# Patient Record
Sex: Male | Born: 1955 | Race: Black or African American | Hispanic: No | Marital: Married | Smoking: Never smoker
Health system: Southern US, Community
[De-identification: ages and names within clinical notes are randomized; demographics above are authoritative.]

## PROBLEM LIST (undated history)

## (undated) DIAGNOSIS — K219 Gastro-esophageal reflux disease without esophagitis: Secondary | ICD-10-CM

## (undated) DIAGNOSIS — E785 Hyperlipidemia, unspecified: Secondary | ICD-10-CM

## (undated) DIAGNOSIS — E0590 Thyrotoxicosis, unspecified without thyrotoxic crisis or storm: Secondary | ICD-10-CM

## (undated) DIAGNOSIS — M4800 Spinal stenosis, site unspecified: Secondary | ICD-10-CM

## (undated) DIAGNOSIS — K317 Polyp of stomach and duodenum: Secondary | ICD-10-CM

## (undated) DIAGNOSIS — G479 Sleep disorder, unspecified: Secondary | ICD-10-CM

## (undated) DIAGNOSIS — Z862 Personal history of diseases of the blood and blood-forming organs and certain disorders involving the immune mechanism: Secondary | ICD-10-CM

## (undated) DIAGNOSIS — R001 Bradycardia, unspecified: Secondary | ICD-10-CM

## (undated) DIAGNOSIS — K8020 Calculus of gallbladder without cholecystitis without obstruction: Secondary | ICD-10-CM

## (undated) DIAGNOSIS — Z9889 Other specified postprocedural states: Secondary | ICD-10-CM

## (undated) DIAGNOSIS — I10 Essential (primary) hypertension: Secondary | ICD-10-CM

## (undated) HISTORY — DX: Sleep disorder, unspecified: G47.9

## (undated) HISTORY — DX: Thyrotoxicosis, unspecified without thyrotoxic crisis or storm: E05.90

## (undated) HISTORY — DX: Calculus of gallbladder without cholecystitis without obstruction: K80.20

## (undated) HISTORY — DX: Hyperlipidemia, unspecified: E78.5

## (undated) HISTORY — PX: CHOLECYSTECTOMY: SHX55

## (undated) HISTORY — DX: Personal history of diseases of the blood and blood-forming organs and certain disorders involving the immune mechanism: Z86.2

## (undated) HISTORY — DX: Gastro-esophageal reflux disease without esophagitis: K21.9

## (undated) HISTORY — PX: NECK SURGERY: SHX720

## (undated) HISTORY — DX: Polyp of stomach and duodenum: K31.7

## (undated) HISTORY — DX: Bradycardia, unspecified: R00.1

---

## 2007-04-15 ENCOUNTER — Observation Stay (HOSPITAL_COMMUNITY): Admission: EM | Admit: 2007-04-15 | Discharge: 2007-04-17 | Payer: Self-pay | Admitting: Emergency Medicine

## 2007-04-16 ENCOUNTER — Encounter (INDEPENDENT_AMBULATORY_CARE_PROVIDER_SITE_OTHER): Payer: Self-pay | Admitting: Internal Medicine

## 2007-04-17 ENCOUNTER — Encounter: Payer: Self-pay | Admitting: Internal Medicine

## 2007-04-23 ENCOUNTER — Ambulatory Visit: Payer: Self-pay | Admitting: Internal Medicine

## 2007-04-24 ENCOUNTER — Encounter: Payer: Self-pay | Admitting: Internal Medicine

## 2007-04-24 ENCOUNTER — Ambulatory Visit: Payer: Self-pay | Admitting: Internal Medicine

## 2007-04-25 ENCOUNTER — Ambulatory Visit (HOSPITAL_COMMUNITY): Admission: RE | Admit: 2007-04-25 | Discharge: 2007-04-25 | Payer: Self-pay | Admitting: Internal Medicine

## 2007-06-27 ENCOUNTER — Encounter (INDEPENDENT_AMBULATORY_CARE_PROVIDER_SITE_OTHER): Payer: Self-pay | Admitting: Surgery

## 2007-06-27 ENCOUNTER — Encounter (INDEPENDENT_AMBULATORY_CARE_PROVIDER_SITE_OTHER): Payer: Self-pay | Admitting: *Deleted

## 2007-06-27 ENCOUNTER — Ambulatory Visit (HOSPITAL_COMMUNITY): Admission: RE | Admit: 2007-06-27 | Discharge: 2007-06-27 | Payer: Self-pay | Admitting: Surgery

## 2008-05-01 ENCOUNTER — Ambulatory Visit: Payer: Self-pay | Admitting: Internal Medicine

## 2008-05-01 DIAGNOSIS — R079 Chest pain, unspecified: Secondary | ICD-10-CM

## 2008-05-01 DIAGNOSIS — R1013 Epigastric pain: Secondary | ICD-10-CM | POA: Insufficient documentation

## 2008-05-02 LAB — CONVERTED CEMR LAB
AST: 24 units/L (ref 0–37)
Albumin: 4.1 g/dL (ref 3.5–5.2)
Amylase: 85 units/L (ref 27–131)
BUN: 11 mg/dL (ref 6–23)
Basophils Relative: 0.6 % (ref 0.0–3.0)
CO2: 0 meq/L — CL (ref 19–32)
Calcium: 9.7 mg/dL (ref 8.4–10.5)
Chloride: 105 meq/L (ref 96–112)
Creatinine, Ser: 1.2 mg/dL (ref 0.4–1.5)
Eosinophils Absolute: 0 10*3/uL (ref 0.0–0.7)
Eosinophils Relative: 0.6 % (ref 0.0–5.0)
GFR calc Af Amer: 82 mL/min
GFR calc non Af Amer: 68 mL/min
Glucose, Bld: 100 mg/dL — ABNORMAL HIGH (ref 70–99)
Lymphocytes Relative: 28.8 % (ref 12.0–46.0)
MCV: 86.1 fL (ref 78.0–100.0)
Monocytes Relative: 12.7 % — ABNORMAL HIGH (ref 3.0–12.0)
Neutrophils Relative %: 57.3 % (ref 43.0–77.0)
Platelets: 255 10*3/uL (ref 150–400)
Potassium: 3.6 meq/L (ref 3.5–5.1)
RBC: 4.73 M/uL (ref 4.22–5.81)
WBC: 5.7 10*3/uL (ref 4.5–10.5)

## 2008-05-09 ENCOUNTER — Ambulatory Visit (HOSPITAL_COMMUNITY): Admission: RE | Admit: 2008-05-09 | Discharge: 2008-05-09 | Payer: Self-pay | Admitting: Internal Medicine

## 2008-05-13 ENCOUNTER — Telehealth (INDEPENDENT_AMBULATORY_CARE_PROVIDER_SITE_OTHER): Payer: Self-pay

## 2008-05-13 ENCOUNTER — Encounter: Payer: Self-pay | Admitting: Internal Medicine

## 2008-09-05 ENCOUNTER — Encounter: Payer: Self-pay | Admitting: Internal Medicine
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
## 2010-08-10 NOTE — H&P (Signed)
NAME:  [name], [name]                 ACCOUNT NO.:  [id number]   MEDICAL RECORD NO.:  [id number]          PATIENT TYPE:  INP   LOCATION:  1429                         FACILITY:  [facility]   PHYSICIAN:  Hind I Elsaid, MD      DATE OF BIRTH:  Oct 05, 1955   DATE OF ADMISSION:  04/15/2007  DATE OF DISCHARGE:                              HISTORY & PHYSICAL   CHIEF COMPLAINT:  Chest pain.   HISTORY OF PRESENT ILLNESS:  This is a 55 year old, African American  male admitted to [facility] with a chief complaint of chest  pain.  Chest pain started three days ago mainly at the left sternal  chest radiating to the left arm associated with numbness in his right  arm.  Pain is intermittent, aggravated with exertion.  Denies any  relieving factor.  Denies any aggravating factor.  Condition associated  with nausea and was pale.  Yesterday, the patient woke up with severe  left retrosternal chest pain which radiated to his left arm associated  with nausea and some paleness.  The patient has a history of chest pain  before, was investigated after he developed cardiac arrest, as per his  wife, at [facility] and the patient was transferred to [facility] where he underwent cardiac cath.  Cardiac cath, as per his  wife, was negative and chest pain was related to GI and the patient was  prescribed Prevacid.  Family was advised to followup with outpatient  EGD.   PAST MEDICAL HISTORY:  1. History of hypertension.  2. History of cardiac arrest.  History regarding the cardiac arrest      cause is really unclear.  The patient was admitted to [facility] with chest pain, received sublingual nitroglycerine.  His      heart rate started to become brady status post code status and      transferred to [facility].  Cause of his cardiac arrest was      unclear.   ALLERGIES:  No known drug allergies.   MEDICATIONS:  1. Atenolol 25 mg p.o. daily.  2. Prevacid 30 mg p.o.  daily.   FAMILY HISTORY:  Father has a history of congestive heart failure,  status post bivalve surgery.  Sister had a pacemaker and he had another  sister with a history of rheumatic fever.  Mother had a history of  hypertension.  Daughter was recently diagnosed with a pulmonary embolism  and diagnosis was factor 5 Leiden deficiency.   SOCIAL HISTORY:  The patient works as a Emergency planning/management officer.  No tobacco, no  alcohol abuse.  No IV drugs.  He is married and has five children.   SYSTEMIC REVIEW:  The patient denies any headache, denies any numbness  or weakness.  Admitted he has some dizziness, but he denies any  shortness of breath.  Denies any rashes.  Denies any syncopal episode.   PHYSICAL EXAMINATION:  VITAL SIGNS:  Temperature 97.7, blood pressure  128/70, pulse rate 66 sometimes drops to 52 on cardiac monitor,  respiratory rate 18,  saturation 98% on room air.  HEENT:  Normocephalic, atraumatic.  Pupils equal and react to light in  accommodation.  NECK:  Supple, no JVD and no thyromegaly.  HEART:  S1 and S2, regular rate and rhythm, no murmur.  LUNGS:  Clear to auscultation bilaterally.  ABDOMEN:  Soft, non-tender, bowel sounds positive.  EXTREMITIES:  No lower extremity edema.  Peripheral pulses intact.  NEUROLOGICAL:  Alert and oriented x 3 with no focal neurological  findings.   BLOOD WORKUP:  CBC showed white blood cells 6.9, hemoglobin 14.5,  hematocrit 42, platelets 290.  BMET sodium  140, potassium 3.4, chloride  101, CO2 32, glucose 123, BUN 16 and creatinine 1.39.  Cardiac markers  x1negative.  Chest x-ray negative for pneumonia or any cardiac process.  EKG sinus brady with 30 degree AV block.   ASSESSMENT/PLAN:  1. This is a 55 year old male with a history of hypertension, history      of previous cardiac arrest - cause was unclear, status post cardiac      cath which was negative in 2004:  Plan for this patient to be      admitted to the telemetry unit, get  cardiac enzymes and 2D echo,      place the patient on aspirin.  Will hold beta blocker as the      patient is sinus brady with 30 degree AV block.  Will consult      cardiology to evaluate the patient.  2. History of gastroesophageal reflux:  Chest pain could be      gastrointestinal in nature.  Will arrange outpatient EGD.  Possible      24-pH monitor.  3. History of hypercoagulable state:  Will get hypercoagulable panel      and CT chest with IV contrast to rule out pulmonary embolism.  DVT      and GI prophylaxis      Hind I Elsaid, MD  Electronically Signed     HIE/MEDQ  D:  04/16/2007  T:  04/16/2007  Job:  [id number]

## 2010-08-10 NOTE — Discharge Summary (Signed)
[name], [name]                 ACCOUNT NO.:  [id number]   MEDICAL RECORD NO.:  [id number]          PATIENT TYPE:  OUT   LOCATION:  XRAY                         FACILITY:  [facility]   PHYSICIAN:  Hind I Elsaid, MD      DATE OF BIRTH:  04-19-1955   DATE OF ADMISSION:  04/25/2007  DATE OF DISCHARGE:  04/25/2007                               DISCHARGE SUMMARY   DISCHARGE DIAGNOSES:  1. Atypical chest pain.  2. Epigastric pain needs to be reevaluated as outpatient.  3. Hypertension.  4. History of cardiac arrest and cause is unclear.  5. Family history of hypercoagulability.   DISCHARGE MEDICATIONS:  1. Hydrochlorothiazide 12.5 mg p.o. daily.  2. Protonix 40 mg p.o. daily.   PROCEDURE:  1. Myocardial view was negative for ischemia.  Normal wall motion      study with calculated left ventricular ejection fraction 62.  2. CT angio negative for pulmonary embolism.  3. Chest x-ray; no active lung disease.   HISTORY OF PRESENT ILLNESS:  Please review the history done by Hind Bosie Helper, M.D.  In summary, he is a 55 year old male with a recent  hospitalization for epigastric and chest pain and was admitted to the  hospital for evaluation of epigastric and chest pain.   HOSPITAL COURSE:  The patient was admitted to the hospital.  EKG and  cardiac enzymes were negative.  The patient has mild episode of  bradycardia.  For that cardiology was consulted and they recommended a  stress Myoview which resulted as above negative.  MI and ischemia was  ruled out.  The patient continued to complain of epigastric pain and as  the patient has a history of hypercoagulable studies diagnosed on his  daughter recently, after discovered to have factor V Leiden deficiency.  Accordingly CT of chest with IV contrast was done which was negative for  any evidence of PE.  Cause of chest pain is most probably  gastroenterology in nature.  The patient accordingly will be discharged  on Protonix p.o.  The patient  had a scheduled appointment to see  Dr.  Leone Payor from [facility] for evaluation of the epigastric  pain, possible EGD and possible 24-hour pH monitor.  The patient's  appointment for Monday, January 26, was given to the patient and his  wife.   History of hypercoagulable state.  As mentioned, daughter was diagnosed  with factor V Leiden deficiency.  Hypercoagulable studies were drawn.  The results are pending.  I will call the patient after the results of  hypercoagulable studies come back.  If any abnormality, the patient may  need to follow with hematology as outpatient.  At this time there is not  any indication to start the patient on any anticoagulation.      Hind Bosie Helper, MD  Electronically Signed     HIE/MEDQ  D:  06/04/2007  T:  06/05/2007  Job:  [id number]

## 2010-08-10 NOTE — Op Note (Signed)
[name], [name]                 ACCOUNT NO.:  [id number]   MEDICAL RECORD NO.:  [id number]          PATIENT TYPE:  AMB   LOCATION:  DAY                          FACILITY:  [facility]   PHYSICIAN:  Thomas A. Cornett, M.D.DATE OF BIRTH:  11/06/1955   DATE OF PROCEDURE:  06/27/2007  DATE OF DISCHARGE:                               OPERATIVE REPORT   PREOPERATIVE DIAGNOSIS:  Symptomatic cholelithiasis.   POSTOPERATIVE DIAGNOSIS:  Symptomatic cholelithiasis.   PROCEDURE:  Laparoscopic cholecystectomy with intraoperative  cholangiogram.   SURGEON:  Harriette Bouillon, M.D.   ASSISTANT:  Wilmon Arms. Tsuei, M.D.   ANESTHESIA:  General endotracheal anesthesia with 0.25% Sensorcaine  local.   ESTIMATED BLOOD LOSS:  10 mL.   SPECIMEN:  Gallbladder to pathology for evaluation.   INDICATIONS FOR PROCEDURE:  The patient is a 55 year old male with  intermittent right upper quadrant pain, nausea, vomiting.  He presents  after workup showed gallstones.  We discussed options of both operative  and nonoperative treatment.  He wished to undergo laparoscopic  cholecystectomy for symptomatic cholelithiasis.  Risks were discussed.  Informed consent was obtained.   DESCRIPTION OF PROCEDURE:  The patient was brought to the operating room  and placed supine.  After induction of general anesthesia, the abdomen  was prepped and draped in sterile fashion.  He received preoperative  antibiotics.  A 1-cm infraumbilical incision was made.  Dissection was  carried down to fascia.  The fascia was opened with a scalpel blade.  Small hemostat was used to spread into the preperitoneal space and I  used my finger to push through the peritoneal lining of the abdominal  cavity without difficulty.  Pursestring suture of 0 Vicryl was placed  and a 12-mm Hasson cannula was placed under direct vision.  Pneumoperitoneum was created to 15 mmHg CO2 and laparoscope was placed.  No evidence of bowel injury with insertion of  this trocar.  The patient  was placed in reverse Trendelenburg.  A laparoscopy performed.  Liver,  stomach, small and large bowel all appeared grossly normal.  Gallbladder  is identified.  The 11 mm subxiphoid port was placed under direct  vision.  Two 5 mL ports were placed in the right upper quadrant under  direct vision.  Gallbladder is identified and grabbed by its dome and  pushed toward the patient's right shoulder.  Some loose omental  adhesions were taken down with the cautery.  The infundibulum was  grasped and pulled toward the patient's right lower quadrant.  Were able  to dissect out both the cystic duct and cystic artery as it coursed onto  the body of the gallbladder.  Once we got around the cystic duct, a clip  was placed on the gallbladder side.  I went ahead and ligated the cystic  artery to give me more length.  We put this between clips.  Small  incision was made in the cystic duct into a separate stab wound and a  Cook cholangiogram catheter was introduced and placed in the cystic  duct.  This was controlled by clip.  Intraoperative cholangiogram was  performed which showed free flow of contrast down a tortuous cystic duct  into the common bile duct with free flow into the duodenum.  There was a  slight pulse.  Contrast went up the common hepatic duct and the right  and left hepatic ducts without difficulty.  No dilatation noted.  No  evidence of stone, stricture or extravasation.  Cholangiogram was  completed, the catheter was removed, the cystic duct was triple clipped  and divided.  The cautery was used to dissect the gallbladder from the  gallbladder fossa.  A clip was placed on a small branch the posterior  cystic artery.  Remainder of the gallbladder came out uneventfully.  It  was placed in EndoCatch bag.  We then irrigated the gallbladder bed and  found it to be hemostatic.  We then extracted the gallbladder through  the umbilical port with the help of the  camera the subxiphoid port.  This was passed off the field.  We closed the fascia at the umbilicus  with a pursestring suture of 0 Vicryl under direct vision.  No evidence  of small bowel, large bowel or other organ injury at this point in the  procedure.  There was no evidence of bleeding or bile leakage.  Excess  irrigation was suctioned out.  We then let the CO2 escape and removed  our ports under direct vision with no signs of port site leak.  Once all  ports were removed.  Skin was closed with 4-0 Monocryl.  Dermabond was  applied.  All final counts of sponge, needle and instruments were found  to be correct at this portion of the case.  The patient was then awoke  and taken to recovery in satisfactory condition.  All final counts were  correct.      Thomas A. Cornett, M.D.  Electronically Signed     TAC/MEDQ  D:  06/27/2007  T:  06/27/2007  Job:  [id number]   cc:   Dr. Meredith Mody   [name], MD,FACG  [facility]  [address] [place], [place] [address]

## 2010-08-10 NOTE — Assessment & Plan Note (Signed)
[facility]                         GASTROENTEROLOGY OFFICE NOTE   [name], [name]                          MRN:          [id number]  DATE:04/23/2007                            DOB:          13-Oct-1955    ADDENDUM:  [name] had a hypercoagulable workup because of a history of  some hypercoagulable issue.  He did have a positive anticardiolipin  antibody IgA, but his IgG and IgM were negative. `     [name], MD,FACG     CEG/MedQ  DD: 04/23/2007  DT: 04/23/2007  Job #: [id number]   cc:   Dr. Doreene Adas, MD

## 2010-12-17 LAB — CARDIAC PANEL(CRET KIN+CKTOT+MB+TROPI)
CK, MB: 1.5
Total CK: 163
Troponin I: 0.04
Troponin I: 0.04

## 2010-12-17 LAB — DIFFERENTIAL
Basophils Relative: 0
Eosinophils Absolute: 0
Lymphs Abs: 1
Neutro Abs: 5.3
Neutrophils Relative %: 77

## 2010-12-17 LAB — CBC
HCT: 39.4
MCHC: 34.2
MCV: 86.3
MCV: 87.2
Platelets: 290
RBC: 4.52
RBC: 4.87
WBC: 5.5
WBC: 6.9

## 2010-12-17 LAB — POCT CARDIAC MARKERS
CKMB, poc: <1 — ABNORMAL LOW
Myoglobin, poc: 78.9
Operator id: 4074

## 2010-12-17 LAB — PROTEIN C, TOTAL: Protein C, Total: 89 % (ref 70–140)

## 2010-12-17 LAB — BASIC METABOLIC PANEL
BUN: 16
BUN: 7
CO2: 30
Calcium: 8.9
Calcium: 9.7
Chloride: 101
Chloride: 107
Creatinine, Ser: 1.14
Creatinine, Ser: 1.39
GFR calc Af Amer: >60
Glucose, Bld: 98

## 2010-12-17 LAB — FACTOR 5 LEIDEN

## 2010-12-17 LAB — LIPID PANEL
LDL Cholesterol: 139 — ABNORMAL HIGH
Total CHOL/HDL Ratio: 6.7
VLDL: 21

## 2010-12-17 LAB — COMPREHENSIVE METABOLIC PANEL
BUN: 13
CO2: 29
Chloride: 104
Creatinine, Ser: 1.2
GFR calc non Af Amer: >60
Total Bilirubin: 1.2

## 2010-12-17 LAB — BETA-2-GLYCOPROTEIN I ABS, IGG/M/A
Beta-2-Glycoprotein I IgA: 7 U/mL (ref <10)
Beta-2-Glycoprotein I IgM: 18 U/mL (ref <10)

## 2010-12-17 LAB — PHOSPHORUS: Phosphorus: 3.4

## 2010-12-17 LAB — LUPUS ANTICOAGULANT PANEL

## 2010-12-17 LAB — HOMOCYSTEINE: Homocysteine: 11.5

## 2010-12-17 LAB — MAGNESIUM: Magnesium: 2.1

## 2010-12-17 LAB — CARDIOLIPIN ANTIBODIES, IGG, IGM, IGA: Anticardiolipin IgA: 40 (ref <13)

## 2010-12-17 LAB — PROTEIN S ACTIVITY: Protein S Activity: 70 % (ref 69–129)

## 2010-12-17 LAB — PROTEIN S, TOTAL: Protein S Ag, Total: 110 % (ref 70–140)

## 2010-12-21 LAB — COMPREHENSIVE METABOLIC PANEL
AST: 26
Albumin: 3.6
CO2: 30
Calcium: 9.1
Creatinine, Ser: 1.08
GFR calc Af Amer: >60
GFR calc non Af Amer: >60
Total Protein: 7.1

## 2010-12-21 LAB — CBC
MCHC: 35.4
MCV: 85.4
Platelets: 242
RDW: 13.7

## 2010-12-21 LAB — DIFFERENTIAL
Eosinophils Relative: 1
Lymphocytes Relative: 24
Lymphs Abs: 1.4
Monocytes Relative: 11

## 2011-02-02 ENCOUNTER — Encounter: Payer: Self-pay | Admitting: *Deleted

## 2011-02-02 ENCOUNTER — Emergency Department (INDEPENDENT_AMBULATORY_CARE_PROVIDER_SITE_OTHER)
Admission: EM | Admit: 2011-02-02 | Discharge: 2011-02-02 | Disposition: A | Discharge status: Home / Self Care | Payer: BC Managed Care – PPO | Source: Home / Self Care | Attending: Family Medicine | Admitting: Family Medicine

## 2011-02-02 DIAGNOSIS — R079 Chest pain, unspecified: Secondary | ICD-10-CM

## 2011-02-02 DIAGNOSIS — R1013 Epigastric pain: Secondary | ICD-10-CM

## 2011-02-02 DIAGNOSIS — I10 Essential (primary) hypertension: Secondary | ICD-10-CM

## 2011-02-02 HISTORY — DX: Essential (primary) hypertension: I10

## 2011-02-02 MED ORDER — IRBESARTAN-HYDROCHLOROTHIAZIDE 300-25 MG PO TABS: 1.0000 | ORAL_TABLET | Freq: Every day | ORAL | Status: DC

## 2011-02-02 MED ORDER — CLONIDINE HCL 0.2 MG PO TABS: 0.2000 mg | ORAL_TABLET | Freq: Once | ORAL | Status: DC

## 2011-02-02 NOTE — ED Notes (Signed)
Patient presents with HTN and a c/o chest tightness that is a "discomfort" and not painful. He was discharged from [facility] 2-3 weeks ago for CP and htn that was attributed to reflux.

## 2011-02-02 NOTE — ED Provider Notes (Signed)
History     CSN: [id number] Arrival date & time:    None       (Consider location/radiation/quality/duration/timing/severity/associated sxs/prior treatment) The history is provided by the patient and the spouse.  The patient is here for evaluation of his hypertension. He had been atn 2-3 weeks ago at [facility] had a stress test admitted for a couple of days and was sent home on low dose Cozaar. He was also placed on HCTZ as well. He was discharged w/a DX of HBP and reflux. He had some vague chest pain but nothing specific earlier and found his BP elevated and came in to be seen.   Review of Systems  Respiratory: Positive for chest tightness.   Cardiovascular: Positive for chest pain.  Gastrointestinal:       Refluxed  Neurological: Negative for light-headedness and headaches.  Psychiatric/Behavioral: Negative for behavioral problems, confusion and agitation.  All other systems reviewed and are negative.    Allergies  NTG    Home Medications  BP 159/98  Pulse 74  Temp(Src) 98.4 F (36.9 C) (Oral)  Resp 16  Ht 6\' 1"  (1.854 m)  Wt 220 lb (99.791 kg)  BMI 29.03 kg/m2  SpO2 99%  Physical Exam  Constitutional: He is oriented to person, place, and time. He appears well-developed and well-nourished.  HENT:  Head: Normocephalic.  Neck: Normal range of motion. Neck supple.  Cardiovascular: Normal rate and regular rhythm.   Pulmonary/Chest: Effort normal and breath sounds normal. No respiratory distress. He has no wheezes.  Musculoskeletal: Normal range of motion.  Neurological: He is alert and oriented to person, place, and time.  Skin: Skin is warm and dry.    ED Course  Procedures (including critical care time) Patient BP has responded to medicationCatapress.2mg  po will allow a 2nd dose of cozaar tonight and switch to Avalide 300/25 or Avapro and HCTZ    MDM          Hassan Rowan, MD 02/04/11 1642

## 2013-03-28 DIAGNOSIS — R001 Bradycardia, unspecified: Secondary | ICD-10-CM

## 2013-03-28 HISTORY — DX: Bradycardia, unspecified: R00.1

## 2013-11-02 ENCOUNTER — Encounter (HOSPITAL_COMMUNITY): Payer: Self-pay | Admitting: Emergency Medicine

## 2013-11-02 ENCOUNTER — Emergency Department (HOSPITAL_COMMUNITY)
Admission: EM | Admit: 2013-11-02 | Discharge: 2013-11-02 | Disposition: A | Discharge status: Home / Self Care | Payer: BC Managed Care – PPO | Attending: Emergency Medicine | Admitting: Emergency Medicine

## 2013-11-02 DIAGNOSIS — Z8739 Personal history of other diseases of the musculoskeletal system and connective tissue: Secondary | ICD-10-CM | POA: Insufficient documentation

## 2013-11-02 DIAGNOSIS — Z8719 Personal history of other diseases of the digestive system: Secondary | ICD-10-CM | POA: Insufficient documentation

## 2013-11-02 DIAGNOSIS — I10 Essential (primary) hypertension: Secondary | ICD-10-CM | POA: Insufficient documentation

## 2013-11-02 DIAGNOSIS — Z79899 Other long term (current) drug therapy: Secondary | ICD-10-CM | POA: Insufficient documentation

## 2013-11-02 HISTORY — DX: Spinal stenosis, site unspecified: M48.00

## 2013-11-02 LAB — URINALYSIS, ROUTINE W REFLEX MICROSCOPIC
Bilirubin Urine: NEGATIVE
GLUCOSE, UA: NEGATIVE mg/dL
Hgb urine dipstick: NEGATIVE
KETONES UR: NEGATIVE mg/dL
LEUKOCYTES UA: NEGATIVE
NITRITE: NEGATIVE
PH: 6 (ref 5.0–8.0)
Protein, ur: NEGATIVE mg/dL
SPECIFIC GRAVITY, URINE: 1.007 (ref 1.005–1.030)
Urobilinogen, UA: 0.2 mg/dL (ref 0.0–1.0)

## 2013-11-02 LAB — COMPREHENSIVE METABOLIC PANEL
ALBUMIN: 3.9 g/dL (ref 3.5–5.2)
ALT: 18 U/L (ref 0–53)
ANION GAP: 12 (ref 5–15)
AST: 20 U/L (ref 0–37)
Alkaline Phosphatase: 73 U/L (ref 39–117)
BILIRUBIN TOTAL: 0.3 mg/dL (ref 0.3–1.2)
BUN: 8 mg/dL (ref 6–23)
CHLORIDE: 102 meq/L (ref 96–112)
CO2: 27 mEq/L (ref 19–32)
CREATININE: 0.98 mg/dL (ref 0.50–1.35)
Calcium: 9.4 mg/dL (ref 8.4–10.5)
GFR calc Af Amer: >90 mL/min (ref >90)
GFR calc non Af Amer: >90 mL/min (ref >90)
Glucose, Bld: 108 mg/dL — ABNORMAL HIGH (ref 70–99)
Potassium: 3.7 mEq/L (ref 3.7–5.3)
Sodium: 141 mEq/L (ref 137–147)
TOTAL PROTEIN: 7.4 g/dL (ref 6.0–8.3)

## 2013-11-02 MED ORDER — LOSARTAN POTASSIUM 25 MG PO TABS
25.0000 mg | ORAL_TABLET | Freq: Once | ORAL | Status: AC
  Administered 2013-11-02: 25 mg via ORAL
  Filled 2013-11-02: qty 1

## 2013-11-02 MED ORDER — IRBESARTAN-HYDROCHLOROTHIAZIDE 300-25 MG PO TABS: 1.0000 | ORAL_TABLET | Freq: Every day | ORAL | Status: DC

## 2013-11-02 MED ORDER — LOSARTAN POTASSIUM 25 MG PO TABS: 25.0000 mg | ORAL_TABLET | Freq: Every day | ORAL | Status: DC

## 2013-11-02 MED ORDER — LISINOPRIL 10 MG PO TABS: 10.0000 mg | ORAL_TABLET | Freq: Every day | ORAL | Status: DC

## 2013-11-02 MED ORDER — AMLODIPINE BESYLATE 5 MG PO TABS: 5.0000 mg | ORAL_TABLET | Freq: Every day | ORAL | Status: DC

## 2013-11-02 MED ORDER — HYDROCHLOROTHIAZIDE 25 MG PO TABS: 25.0000 mg | ORAL_TABLET | Freq: Every day | ORAL | Status: DC

## 2013-11-02 MED ORDER — HYDROCHLOROTHIAZIDE 50 MG PO TABS
50.0000 mg | ORAL_TABLET | Freq: Every day | ORAL | Status: DC
  Administered 2013-11-02: 50 mg via ORAL
  Filled 2013-11-02: qty 1

## 2013-11-02 NOTE — ED Provider Notes (Signed)
CSN: [id number]     Arrival date & time 11/02/13  0507 History   First MD Initiated Contact with Patient 11/02/13 [phone]     Chief Complaint  Patient presents with  . Hypertension    HPI Patient with PMH of Hypertension presents with "not feeling right," that started around midnight. He was unable to go back to sleep and at 4am he went to [facility] to take his blood pressure and it was 220/105. He has not taken his blood pressure medicine for 3 weeks because his PCP is in [place]. He reports no adversive effects from HTN meds. He denies chest pain, SOB, palpitations, peripheral edema, visual changes, weakness or confusion. He denies N/V/D or abdominal or back pain. His wife notes that he had urinary frequency.   Past Medical History  Diagnosis Date  . Hypertension   . Reflux   . Spinal stenosis    Past Surgical History  Procedure Laterality Date  . Cholecystectomy     Family History  Problem Relation Age of Onset  . Heart failure Father   . Hypertension Father   . Lung cancer Sister   . Pancreatic cancer Sister   . Cancer Sister   . Hypertension Mother    History  Substance Use Topics  . Smoking status: Never Smoker   . Smokeless tobacco: Never Used  . Alcohol Use: No    Review of Systems  Constitutional: Negative for fever and chills.  HENT: Negative for congestion and rhinorrhea.   Eyes: Negative for visual disturbance.  Respiratory: Negative for cough and shortness of breath.   Cardiovascular: Negative for chest pain and palpitations.  Gastrointestinal: Negative for nausea, vomiting and diarrhea.  Genitourinary: Negative for dysuria and hematuria.  Musculoskeletal: Negative for back pain.  Skin: Negative for rash.  Neurological: Negative for weakness and headaches.      Allergies  Nitroglycerin and Neurontin  Home Medications   Prior to Admission medications   Medication Sig Start Date End Date Taking? Authorizing Provider  hydrochlorothiazide (HYDRODIURIL) 25 MG  tablet Take 25 mg by mouth daily.      Historical Provider, MD  irbesartan-hydrochlorothiazide (AVALIDE) 300-25 MG per tablet Take 1 tablet by mouth daily. 02/02/11 02/02/12  Hassan RowanEugene Nunnelley, MD  losartan (COZAAR) 25 MG tablet Take 25 mg by mouth daily.      Historical Provider, MD   BP 168/91  Pulse 54  Temp(Src) 98.6 F (37 C) (Oral)  Resp 18  Ht 6\' 1"  (1.854 m)  Wt 215 lb (97.523 kg)  BMI 28.37 kg/m2  SpO2 97% Physical Exam  Nursing note and vitals reviewed. Constitutional: He is oriented to person, place, and time. He appears well-developed and well-nourished. No distress.  HENT:  Head: Normocephalic and atraumatic.  Mouth/Throat: Oropharynx is clear and moist.  Eyes: Conjunctivae and EOM are normal. Pupils are equal, round, and reactive to light. Right eye exhibits no discharge. Left eye exhibits no discharge. No scleral icterus.  Neck: No JVD present.  Cardiovascular: Normal rate, regular rhythm and normal heart sounds.   Pulmonary/Chest: Effort normal and breath sounds normal. No respiratory distress. He has no wheezes.  Abdominal: Soft. Bowel sounds are normal. He exhibits no distension. There is no tenderness.  Musculoskeletal: Normal range of motion. He exhibits no edema and no tenderness.  Neurological: He is alert and oriented to person, place, and time. He exhibits normal muscle tone. Coordination normal.  Strength and sensation intact bilaterally.  Skin: Skin is warm and dry. He is not  diaphoretic.  Psychiatric: He has a normal mood and affect. His behavior is normal.    ED Course  Procedures (including critical care time) Labs Review Labs Reviewed  URINALYSIS, ROUTINE W REFLEX MICROSCOPIC  COMPREHENSIVE METABOLIC PANEL    Imaging Review No results found.   EKG Interpretation None      MDM   Final diagnoses:  Essential hypertension   Patient with PMH of HTN who has not taken his meds for 3 weeks presents with "not feeling right," and HTN. BP on arrival was  190/96. Patient denies CP, back pain, visual changes. Patient administered home HTN medication regimen in ED. Patient's EKG and CMP unremarkable. Repeat BP check was 168/91 with no new symptomology. I doubt hypertensive emergency due to no evidence of end-organ damage. Patient encouraged to find a PCP nearby and to take his HTN meds daily. Patient given resources for new PCP. Patient advised of the long term effects of elevated bp. Will refill HTN meds for 2 months.  Patient complained of urinary frequency and UA is unremarkable.  Discussed return precautions with patient. Discussed all results and patient verbalizes understanding and agrees with plan.  This is a shared patient. This patient was discussed with the physician who saw and evaluated the patient.     Louann Sjogren, PA-C 11/02/13 [phone]

## 2013-11-02 NOTE — Discharge Instructions (Signed)
Return to the emergency room with worsening of symptoms or with symptoms that are concerning. Follow up with PCP. Resources provided below.   Hypertension Hypertension, commonly called high blood pressure, is when the force of blood pumping through your arteries is too strong. Your arteries are the blood vessels that carry blood from your heart throughout your body. A blood pressure reading consists of a higher number over a lower number, such as 110/72. The higher number (systolic) is the pressure inside your arteries when your heart pumps. The lower number (diastolic) is the pressure inside your arteries when your heart relaxes. Ideally you want your blood pressure below 120/80. Hypertension forces your heart to work harder to pump blood. Your arteries may become narrow or stiff. Having hypertension puts you at risk for heart disease, stroke, and other problems.  RISK FACTORS Some risk factors for high blood pressure are controllable. Others are not.  Risk factors you cannot control include:   Race. You may be at higher risk if you are African American.  Age. Risk increases with age.  Gender. Men are at higher risk than women before age 56 years. After age 56, women are at higher risk than men. Risk factors you can control include:  Not getting enough exercise or physical activity.  Being overweight.  Getting too much fat, sugar, calories, or salt in your diet.  Drinking too much alcohol. SIGNS AND SYMPTOMS Hypertension does not usually cause signs or symptoms. Extremely high blood pressure (hypertensive crisis) may cause headache, anxiety, shortness of breath, and nosebleed. DIAGNOSIS  To check if you have hypertension, your health care provider will measure your blood pressure while you are seated, with your arm held at the level of your heart. It should be measured at least twice using the same arm. Certain conditions can cause a difference in blood pressure between your right and  left arms. A blood pressure reading that is higher than normal on one occasion does not mean that you need treatment. If one blood pressure reading is high, ask your health care provider about having it checked again. TREATMENT  Treating high blood pressure includes making lifestyle changes and possibly taking medicine. Living a healthy lifestyle can help lower high blood pressure. You may need to change some of your habits. Lifestyle changes may include:  Following the DASH diet. This diet is high in fruits, vegetables, and whole grains. It is low in salt, red meat, and added sugars.  Getting at least 2 hours of brisk physical activity every week.  Losing weight if necessary.  Not smoking.  Limiting alcoholic beverages.  Learning ways to reduce stress. If lifestyle changes are not enough to get your blood pressure under control, your health care provider may prescribe medicine. You may need to take more than one. Work closely with your health care provider to understand the risks and benefits. HOME CARE INSTRUCTIONS  Have your blood pressure rechecked as directed by your health care provider.   Take medicines only as directed by your health care provider. Follow the directions carefully. Blood pressure medicines must be taken as prescribed. The medicine does not work as well when you skip doses. Skipping doses also puts you at risk for problems.   Do not smoke.   Monitor your blood pressure at home as directed by your health care provider. SEEK MEDICAL CARE IF:   You think you are having a reaction to medicines taken.  You have recurrent headaches or feel dizzy.  You have  swelling in your ankles.  You have trouble with your vision. SEEK IMMEDIATE MEDICAL CARE IF:  You develop a severe headache or confusion.  You have unusual weakness, numbness, or feel faint.  You have severe chest or abdominal pain.  You vomit repeatedly.  You have trouble breathing. MAKE SURE  YOU:   Understand these instructions.  Will watch your condition.  Will get help right away if you are not doing well or get worse. Document Released: 03/14/2005 Document Revised: 07/29/2013 Document Reviewed: 01/04/2013 [facility] Patient Information 2015 [place], [place]. This information is not intended to replace advice given to you by your health care provider. Make sure you discuss any questions you have with your health care provider.   Emergency Department Resource Guide 1) Find a Doctor and Pay Out of Pocket Although you won't have to find out who is covered by your insurance plan, it is a good idea to ask around and get recommendations. You will then need to call the office and see if the doctor you have chosen will accept you as a new patient and what types of options they offer for patients who are self-pay. Some doctors offer discounts or will set up payment plans for their patients who do not have insurance, but you will need to ask so you aren't surprised when you get to your appointment.  2) Contact Your Local Health Department Not all health departments have doctors that can see patients for sick visits, but many do, so it is worth a call to see if yours does. If you don't know where your local health department is, you can check in your phone book. The CDC also has a tool to help you locate your state's health department, and many state websites also have listings of all of their local health departments.  3) Find a Walk-in Clinic If your illness is not likely to be very severe or complicated, you may want to try a walk in clinic. These are popping up all over the country in pharmacies, drugstores, and shopping centers. They're usually staffed by nurse practitioners or physician assistants that have been trained to treat common illnesses and complaints. They're usually fairly quick and inexpensive. However, if you have serious medical issues or chronic medical problems, these are  probably not your best option.  No Primary Care Doctor: - Call Health Connect at  [phone] - they can help you locate a primary care doctor that  accepts your insurance, provides certain services, etc. - Physician Referral Service- [phone]  Chronic Pain Problems: Organization         Address  Phone   Notes  [facility]  [phone] Patients need to be referred by their primary care doctor.   Medication Assistance: Organization         Address  Phone   Notes  [facility] Medication [facility] [address] [place] [place], [place] [address] [phone] --Must be a resident of [facility] -- Must have NO insurance coverage whatsoever (no Medicaid/ Medicare, etc.) -- The pt. MUST have a primary care doctor that directs their care regularly and follows them in the community   [facility]  [phone]   [employer]  [phone]    Agencies that provide inexpensive medical care: Organization         Address  Phone   Notes  [facility]  [phone]   [facility]    [phone]  [facility] [address] [place], [place] [address] [phone]   [facility] [address] [place] [place] [phone]   Planned Parenthood    [phone]   [facility]    [phone]   [facility] and [facility]  [address], [place] Phone:  [phone], Fax:  [phone] Hours of Operation:  9 am - 6 pm, M-F.  Also accepts Medicaid/Medicare and self-pay.  [facility]  [address], [place] Phone: [phone], Fax: [phone]. Hours of Operation:  8:30 am - 5:30 pm, M-F.  Also accepts Medicaid and self-pay.  [facility] [place] [address], [place] Phone: [phone]   [facility] [address] Natasha Bence [place], [place] [phone], Ext. [phone] Mondays & Thursdays:  7-9 AM.  First 15 patients are seen on a first come, first serve basis.    Medicaid-accepting [facility] Providers:  Organization         Address  Phone   Notes  [facility] [address], [place] [phone] Also accepts self-pay patients.  [facility] [address] Laurell Josephs [place], [place]  [phone]   [facility] [address], [place] [phone]   [facility] [address], [place] [phone]   Renaye Rakers [address], [place]   [phone] Only accepts [place] Access [place] patients after they have their name applied to their card.   Self-Pay (no insurance) in [facility]:  Organization         Address  Phone   Notes  Sickle Cell Patients, [facility] [address] [place], [place] [phone]   [facility] [address] [place], [place] [phone]   [facility] [place]  [address],  [phone]   [facility]/Dr. Osei-Bonsu  [address] [place] or [address], [place] [phone] Phone number for both [place] and [place] locations is the same.  Urgent Medical and [facility] [address], [place] [phone]   [facility] [address] [place] or [address] [phone]   [facility] [address], [place] [phone], phone; [phone], fax Sees patients 1st and 3rd Saturday of every month.  Must not qualify for public or private insurance (i.e. Medicaid, Medicare, [place] Health Choice, Veterans' Benefits)  Household income should be no more than 200% of the poverty level The clinic cannot treat you if you are pregnant or think you are pregnant  Sexually transmitted diseases are not treated at the clinic.     Dental Care: Organization         Address  Phone  Notes  [facility] Department of [facility] [address] [place], [place] [phone] Accepts children up to age 77 who are enrolled in [place] or [place] Health Choice; pregnant women with a Medicaid card; and children who have applied for Medicaid or [place] Health Choice, but were declined, whose parents can pay a reduced fee at time of service.  [facility] Department of [facility]  [address], [place] [phone] Accepts children up to age 62 who  are enrolled in Medicaid or [place] Health Choice; pregnant women with a Medicaid card; and children who have applied for Medicaid or  Health Choice, but were declined, whose parents can pay a reduced fee at time of service.  [facility]  [address] [place], [place] [phone] Patients are seen by appointment only. Walk-ins are not accepted. [facility] will see patients 38 years of age and older. Monday - Tuesday (8am-5pm) Most Wednesdays (8:30-5pm) $30 per visit, cash only  [facility] Adult Dental Access PROGRAM  [address], [facility] [phone] Patients are seen by appointment only. Walk-ins are not accepted. [facility] will see patients 4 years of age and older. One Wednesday Evening (Monthly: Volunteer Based).  $30 per visit, cash only  [employer]  [phone] for adults; Children under age 56, call Graduate Pediatric Dentistry at [phone]. Children aged 35-14, please call [phone] to request a pediatric application.  Dental services are provided in all areas of dental care including fillings, crowns and bridges, complete and partial dentures, implants, gum treatment, root canals, and extractions. Preventive care is also provided. Treatment is provided to both adults and children. Patients are selected via a lottery and there is often a waiting  list.   [facility] [address] [place]  [phone] [email or web address]   [facility] [address] [place], [place] [phone], Ext. [phone] Second and Fourth Thursday of each month, opens at 6:30 AM; Clinic ends at 9 AM.  Patients are seen on a first-come first-served basis, and a limited number are seen during each clinic.   [facility]  [address] Ether Griffins [place], [place] [phone]   Eligibility Requirements You must have lived in [place], [place], or [place] counties for at least the last three months.   You cannot be eligible for state or federal sponsored [place], including [employer], [place], or [employer].   You generally cannot be eligible for healthcare insurance through your employer.    How to apply: Eligibility screenings are held every Tuesday and Wednesday afternoon from 1:00 pm until 4:00 pm. You do not need an appointment for the interview!  [facility] [address] [place], [place] [phone]   [facility] Health Department  [phone]   [facility] Health Department  [phone]   [facility] Health Department  [phone]    Behavioral Health Resources in the Community: Intensive Outpatient Programs Organization         Address  Phone  Notes  [facility] [address] [place], [place] [phone]   [facility] Outpatient [address] [place], [place] [phone]   [facility] [address], [place], [place]  [phone]   [facility] Mental Health [address],  [place], [place] [phone] or [phone]   Substance Abuse Resources Organization         Address  Phone  Notes  [facility]  [phone]   [facility]  [phone]   [facility]  8162728429   Floydene Flock  [phone]   Residential & Outpatient Substance Abuse Program   [phone]   Psychological Services Organization         Address  Phone  Notes  [facility] Behavioral Health  [phone]   [facility] Services  [phone]   [facility] Mental Health [address],  [place] [phone] or [phone]    Mobile Crisis Teams Organization         Address  Phone  Notes  [facility], Mobile Crisis Care Unit  [phone]   [facility]  [address] [place], [place] [phone]   Doristine Locks [address] [place] [phone]    Self-Help/Support Groups Organization         Address  Phone             Notes  [facility] [place] - variety of support groups  336- [phone] Call for more information  Narcotics Anonymous (NA), [facility] [address], [employer] [place]  2 meetings at this location   Statistician         Address  Phone  Notes  [facility] [address],    [place]  [phone]   [facility]  [address] [place] [address], [place], [place] [phone]   [facility] [address] [place], [place] [phone] Admissions: 8am-3pm M-F  [facility] [address]    [place], [place] [phone]   [facility] [address] [place], [place], [place] [phone]   [facility] [address],  [place], [place] [phone]   [facility] - Intensive Outpatient [address] Laurell Josephs [address], [place], [place] [phone]   [facility] ([facility]iction [facility] [address] [place]  [place], [place] [phone] or [phone]   [facility] ([facility]) [address], [place], [place] [phone] Accepts Medicaid  [facility] [address],  [place] [phone] Substance Abuse/Addiction Treatment   [facility] Organization          Address  Phone  Notes  [facility]  [phone]   Angie Fava, PhD [address] Ervin Knack [place], [place]   [phone] or [phone]   [facility] Behavioral   [address] [place], [place] [phone]   [facility] [address], [place], [place] [phone] Insurance/Medicaid/sponsorship through [facility] and Families [address]                                    [place], [place] [phone] Therapy/tele-psych/case  [facility] [address], [place] [phone]    Dr. Lolly Mustache  [phone]   [facility]  [employer] [facility] Dept. 1) [address] [place] 2) [address], [place] 3)  [address], [place] [phone] [phone]  [phone]   [facility] Child Abuse Hotline [phone] or [phone] (After Hours)

## 2013-11-02 NOTE — ED Provider Notes (Signed)
The patient has a history of hypertension, this has gone untreated for over a month. On my exam the patient has a soft abdomen, clear heart and lung sounds, no peripheral edema. EKG unremarkable, lab work thus far unremarkable, discussed with patient and will be discharged home in a stable condition as his blood pressure has improved after being given dosing of his home medications. He has requested medications that are less expensive thus preventing him from taking the medication and he is to take. Prescriptions given, followup was given, stable for discharge  Medical screening examination/treatment/procedure(s) were conducted as a shared visit with non-physician practitioner(s) and myself.  I personally evaluated the patient during the encounter.  Clinical Impression: Hypertension  Results for orders placed during the hospital encounter of 11/02/13  COMPREHENSIVE METABOLIC PANEL      Result Value Ref Range   Sodium 141  137 - 147 mEq/L   Potassium 3.7  3.7 - 5.3 mEq/L   Chloride 102  96 - 112 mEq/L   CO2 27  19 - 32 mEq/L   Glucose, Bld 108 (*) 70 - 99 mg/dL   BUN 8  6 - 23 mg/dL   Creatinine, Ser 1.610.98  0.50 - 1.35 mg/dL   Calcium 9.4  8.4 - 09.610.5 mg/dL   Total Protein 7.4  6.0 - 8.3 g/dL   Albumin 3.9  3.5 - 5.2 g/dL   AST 20  0 - 37 U/L   ALT 18  0 - 53 U/L   Alkaline Phosphatase 73  39 - 117 U/L   Total Bilirubin 0.3  0.3 - 1.2 mg/dL   GFR calc non Af Amer >90  >90 mL/min   GFR calc Af Amer >90  >90 mL/min   Anion gap 12  5 - 15  URINALYSIS, ROUTINE W REFLEX MICROSCOPIC      Result Value Ref Range   Color, Urine YELLOW  YELLOW   APPearance CLEAR  CLEAR   Specific Gravity, Urine 1.007  1.005 - 1.030   pH 6.0  5.0 - 8.0   Glucose, UA NEGATIVE  NEGATIVE mg/dL   Hgb urine dipstick NEGATIVE  NEGATIVE   Bilirubin Urine NEGATIVE  NEGATIVE   Ketones, ur NEGATIVE  NEGATIVE mg/dL   Protein, ur NEGATIVE  NEGATIVE mg/dL   Urobilinogen, UA 0.2  0.0 - 1.0 mg/dL   Nitrite NEGATIVE   NEGATIVE   Leukocytes, UA NEGATIVE  NEGATIVE   No results found.         Vida RollerBrian D Shontay Wallner, MD 11/02/13 [phone]

## 2013-11-02 NOTE — ED Notes (Signed)
Pt states that he began to feel "not right" around midnight; pt reports increased BP reading on home machine; pt reports feeling restless, dizzy and headache; [facility] and [facility]; pt denies chest pain; pt states "I just don't feel right"

## 2014-01-29 ENCOUNTER — Emergency Department (HOSPITAL_COMMUNITY): Payer: Self-pay

## 2014-01-29 ENCOUNTER — Encounter (HOSPITAL_COMMUNITY)
Admission: EM | Disposition: A | Discharge status: Home / Self Care | Payer: Self-pay | Source: Home / Self Care | Attending: Cardiovascular Disease

## 2014-01-29 ENCOUNTER — Observation Stay (HOSPITAL_COMMUNITY)
Admission: EM | Admit: 2014-01-29 | Discharge: 2014-01-30 | Disposition: A | Discharge status: Home / Self Care | Payer: BC Managed Care – PPO | Attending: Cardiovascular Disease | Admitting: Cardiovascular Disease

## 2014-01-29 ENCOUNTER — Encounter (HOSPITAL_COMMUNITY): Payer: Self-pay | Admitting: *Deleted

## 2014-01-29 DIAGNOSIS — R55 Syncope and collapse: Secondary | ICD-10-CM | POA: Insufficient documentation

## 2014-01-29 DIAGNOSIS — Z791 Long term (current) use of non-steroidal anti-inflammatories (NSAID): Secondary | ICD-10-CM | POA: Insufficient documentation

## 2014-01-29 DIAGNOSIS — R0789 Other chest pain: Secondary | ICD-10-CM | POA: Insufficient documentation

## 2014-01-29 DIAGNOSIS — R079 Chest pain, unspecified: Secondary | ICD-10-CM

## 2014-01-29 DIAGNOSIS — R001 Bradycardia, unspecified: Secondary | ICD-10-CM

## 2014-01-29 DIAGNOSIS — I200 Unstable angina: Secondary | ICD-10-CM

## 2014-01-29 DIAGNOSIS — I2510 Atherosclerotic heart disease of native coronary artery without angina pectoris: Secondary | ICD-10-CM

## 2014-01-29 DIAGNOSIS — Z9889 Other specified postprocedural states: Secondary | ICD-10-CM

## 2014-01-29 DIAGNOSIS — I209 Angina pectoris, unspecified: Principal | ICD-10-CM

## 2014-01-29 DIAGNOSIS — I10 Essential (primary) hypertension: Secondary | ICD-10-CM | POA: Diagnosis present

## 2014-01-29 DIAGNOSIS — Z79899 Other long term (current) drug therapy: Secondary | ICD-10-CM | POA: Insufficient documentation

## 2014-01-29 DIAGNOSIS — R0602 Shortness of breath: Secondary | ICD-10-CM | POA: Insufficient documentation

## 2014-01-29 HISTORY — DX: Other specified postprocedural states: Z98.890

## 2014-01-29 HISTORY — PX: LEFT HEART CATHETERIZATION WITH CORONARY ANGIOGRAM: SHX5451

## 2014-01-29 LAB — BASIC METABOLIC PANEL
Anion gap: 12 (ref 5–15)
BUN: 12 mg/dL (ref 6–23)
CHLORIDE: 99 meq/L (ref 96–112)
CO2: 29 mEq/L (ref 19–32)
Calcium: 9.3 mg/dL (ref 8.4–10.5)
Creatinine, Ser: 1.05 mg/dL (ref 0.50–1.35)
GFR calc Af Amer: 90 mL/min — ABNORMAL LOW (ref >90)
GFR calc non Af Amer: 78 mL/min — ABNORMAL LOW (ref >90)
GLUCOSE: 106 mg/dL — AB (ref 70–99)
POTASSIUM: 3.7 meq/L (ref 3.7–5.3)
Sodium: 140 mEq/L (ref 137–147)

## 2014-01-29 LAB — CBC WITH DIFFERENTIAL/PLATELET
Basophils Absolute: 0 10*3/uL (ref 0.0–0.1)
Basophils Relative: 0 % (ref 0–1)
EOS ABS: 0.1 10*3/uL (ref 0.0–0.7)
Eosinophils Relative: 1 % (ref 0–5)
HCT: 41.3 % (ref 39.0–52.0)
HEMOGLOBIN: 13.8 g/dL (ref 13.0–17.0)
LYMPHS ABS: 2 10*3/uL (ref 0.7–4.0)
LYMPHS PCT: 36 % (ref 12–46)
MCH: 28.9 pg (ref 26.0–34.0)
MCHC: 33.4 g/dL (ref 30.0–36.0)
MCV: 86.6 fL (ref 78.0–100.0)
Monocytes Absolute: 0.5 10*3/uL (ref 0.1–1.0)
Monocytes Relative: 9 % (ref 3–12)
NEUTROS ABS: 3 10*3/uL (ref 1.7–7.7)
NEUTROS PCT: 54 % (ref 43–77)
Platelets: 233 10*3/uL (ref 150–400)
RBC: 4.77 MIL/uL (ref 4.22–5.81)
RDW: 13.1 % (ref 11.5–15.5)
WBC: 5.6 10*3/uL (ref 4.0–10.5)

## 2014-01-29 LAB — I-STAT TROPONIN, ED: TROPONIN I, POC: 0.01 ng/mL (ref 0.00–0.08)

## 2014-01-29 LAB — HEPATIC FUNCTION PANEL
ALBUMIN: 3.7 g/dL (ref 3.5–5.2)
ALT: 21 U/L (ref 0–53)
AST: 22 U/L (ref 0–37)
Alkaline Phosphatase: 66 U/L (ref 39–117)
Bilirubin, Direct: <0.2 mg/dL (ref 0.0–0.3)
TOTAL PROTEIN: 7.3 g/dL (ref 6.0–8.3)
Total Bilirubin: 0.4 mg/dL (ref 0.3–1.2)

## 2014-01-29 LAB — CBG MONITORING, ED: GLUCOSE-CAPILLARY: 112 mg/dL — AB (ref 70–99)

## 2014-01-29 LAB — PROTIME-INR
INR: 1.13 (ref 0.00–1.49)
PROTHROMBIN TIME: 14.7 s (ref 11.6–15.2)

## 2014-01-29 LAB — SEDIMENTATION RATE: SED RATE: 5 mm/h (ref 0–16)

## 2014-01-29 LAB — TROPONIN I

## 2014-01-29 LAB — C-REACTIVE PROTEIN: CRP: <0.5 mg/dL — ABNORMAL LOW (ref <0.60)

## 2014-01-29 SURGERY — LEFT HEART CATHETERIZATION WITH CORONARY ANGIOGRAM
Anesthesia: LOCAL

## 2014-01-29 MED ORDER — HEPARIN (PORCINE) IN NACL 2-0.9 UNIT/ML-% IJ SOLN
INTRAMUSCULAR | Status: AC
  Filled 2014-01-29: qty 500

## 2014-01-29 MED ORDER — VERAPAMIL HCL 2.5 MG/ML IV SOLN
INTRAVENOUS | Status: AC
  Filled 2014-01-29: qty 2

## 2014-01-29 MED ORDER — MIDAZOLAM HCL 2 MG/2ML IJ SOLN
INTRAMUSCULAR | Status: AC
  Filled 2014-01-29: qty 2

## 2014-01-29 MED ORDER — ACETAMINOPHEN 325 MG PO TABS: 650.0000 mg | ORAL_TABLET | ORAL | Status: DC | PRN

## 2014-01-29 MED ORDER — HEPARIN (PORCINE) IN NACL 2-0.9 UNIT/ML-% IJ SOLN
INTRAMUSCULAR | Status: AC
  Filled 2014-01-29: qty 1000

## 2014-01-29 MED ORDER — IRBESARTAN-HYDROCHLOROTHIAZIDE 300-25 MG PO TABS: 1.0000 | ORAL_TABLET | Freq: Every day | ORAL | Status: DC

## 2014-01-29 MED ORDER — ASPIRIN 81 MG PO CHEW
81.0000 mg | CHEWABLE_TABLET | Freq: Every day | ORAL | Status: DC
  Administered 2014-01-30: 81 mg via ORAL
  Filled 2014-01-29: qty 1

## 2014-01-29 MED ORDER — LIDOCAINE HCL (PF) 1 % IJ SOLN
INTRAMUSCULAR | Status: AC
  Filled 2014-01-29: qty 30

## 2014-01-29 MED ORDER — ONDANSETRON HCL 4 MG/2ML IJ SOLN: 4.0000 mg | Freq: Four times a day (QID) | INTRAMUSCULAR | Status: DC | PRN

## 2014-01-29 MED ORDER — ASPIRIN 81 MG PO CHEW: 324.0000 mg | CHEWABLE_TABLET | Freq: Once | ORAL | Status: DC

## 2014-01-29 MED ORDER — FENTANYL CITRATE 0.05 MG/ML IJ SOLN
INTRAMUSCULAR | Status: AC
  Filled 2014-01-29: qty 2

## 2014-01-29 MED ORDER — NITROGLYCERIN 1 MG/10 ML FOR IR/CATH LAB
INTRA_ARTERIAL | Status: AC
  Filled 2014-01-29: qty 10

## 2014-01-29 MED ORDER — HEPARIN SODIUM (PORCINE) 1000 UNIT/ML IJ SOLN
INTRAMUSCULAR | Status: AC
Start: 2014-01-29 — End: 2014-01-29
  Filled 2014-01-29: qty 1

## 2014-01-29 MED ORDER — AMLODIPINE BESYLATE 5 MG PO TABS
5.0000 mg | ORAL_TABLET | Freq: Every day | ORAL | Status: DC
  Administered 2014-01-29 – 2014-01-30 (×2): 5 mg via ORAL
  Filled 2014-01-29 (×2): qty 1

## 2014-01-29 MED ORDER — ASPIRIN 81 MG PO CHEW
324.0000 mg | CHEWABLE_TABLET | Freq: Once | ORAL | Status: AC
  Administered 2014-01-29: 324 mg via ORAL
  Filled 2014-01-29: qty 4

## 2014-01-29 MED ORDER — SODIUM CHLORIDE 0.9 % IV SOLN: INTRAVENOUS | Status: DC

## 2014-01-29 MED ORDER — ATROPINE SULFATE 0.1 MG/ML IJ SOLN
INTRAMUSCULAR | Status: AC
  Filled 2014-01-29: qty 10

## 2014-01-29 MED ORDER — NITROGLYCERIN IN D5W 200-5 MCG/ML-% IV SOLN
INTRAVENOUS | Status: AC
  Administered 2014-01-29: 5 ug
  Filled 2014-01-29: qty 250

## 2014-01-29 MED ORDER — MORPHINE SULFATE 4 MG/ML IJ SOLN
4.0000 mg | Freq: Once | INTRAMUSCULAR | Status: AC
  Administered 2014-01-29: 4 mg via INTRAVENOUS
  Filled 2014-01-29: qty 1

## 2014-01-29 MED ORDER — SODIUM CHLORIDE 0.9 % IV SOLN
INTRAVENOUS | Status: AC
  Administered 2014-01-29: 17:00:00 via INTRAVENOUS

## 2014-01-29 MED ORDER — HYDROCHLOROTHIAZIDE 25 MG PO TABS
25.0000 mg | ORAL_TABLET | Freq: Every day | ORAL | Status: DC
  Administered 2014-01-29: 25 mg via ORAL
  Filled 2014-01-29 (×2): qty 1

## 2014-01-29 MED ORDER — IRBESARTAN 300 MG PO TABS
300.0000 mg | ORAL_TABLET | Freq: Every day | ORAL | Status: DC
  Administered 2014-01-29: 300 mg via ORAL
  Filled 2014-01-29 (×2): qty 1

## 2014-01-29 MED ORDER — OXYCODONE-ACETAMINOPHEN 5-325 MG PO TABS: 1.0000 | ORAL_TABLET | ORAL | Status: DC | PRN

## 2014-01-29 NOTE — H&P (Addendum)
ADMISSION NOTE Primary MD: Dr. Lara [place], [place], [place] Cardiology: (new) Dr. Bishop Limbo Sallee Hogrefe   PATIENT PROFILE: Jason SparrowRobert West is a 58 y.o. 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 He now resides in [place].  He presented to [facility] emergency room this morning after being awakened from sleep with significant chest tightness associated with diaphoresis and shortness of breath.   HPI:  Jason SparrowRobert West is a 58 y.o. male who has a long-standing history of hypertension for at least 15 years.  Over the past several months he has had accelerated blood pressure with reported blood pressure at times in excess of 200 mm systolic.  He has been followed by his primary care physician who has made medication adjustment.  Over the past week, he has noticed a sensation of vague chest tightness which he initially had not paid much attention to during periods of increased activity.  At approximately 4 AM this morning he was awakened from a deep sleep with significant substernal chest pressure, associated with diaphoresis and mild shortness of breath.  He ultimately presented to [facility] room.  ECG did not reveal acute ST segment changes.  He has continued to experience mild residual chest tightness and has had episodes of bradycardia with heart rates dropping down into the low 40s.  Cardiology evaluation was recommended.  Presently still notes mild residual chest ache and his blood pressure is 164/90.  Remotely, the patient states approximately 10 years ago he was given a sl nitroglycerin pill in which she became bradycardic shortly after taking the second dose.  Upon further questioning with the wife, she has noticed that the patient snores heavily at night.  She has witnessed apparent apneic spells during his sleep.    Past Medical History  Diagnosis Date  . Hypertension   . Reflux   . Spinal stenosis     Past Surgical History  Procedure Laterality Date  .  Cholecystectomy      Allergies  Allergen Reactions  . Nitroglycerin     Asystole after 2nd dose previously  . Neurontin [Gabapentin] Palpitations    Current Facility-Administered Medications  Medication Dose Route Frequency Provider Last Rate Last Dose  . atropine 0.1 MG/ML injection            Current Outpatient Prescriptions  Medication Sig Dispense Refill  . naproxen sodium (ANAPROX) 220 MG tablet Take 440 mg by mouth 2 (two) times daily as needed (for pain.).    Marland Kitchen. olmesartan-hydrochlorothiazide (BENICAR HCT) 20-12.5 MG per tablet Take 1 tablet by mouth daily.    Marland Kitchen. amLODipine (NORVASC) 5 MG tablet Take 1 tablet (5 mg total) by mouth daily. 30 tablet 1  . hydrochlorothiazide (HYDRODIURIL) 25 MG tablet Take 25 mg by mouth daily.      . hydrochlorothiazide (HYDRODIURIL) 25 MG tablet Take 1 tablet (25 mg total) by mouth daily. 30 tablet 1  . irbesartan-hydrochlorothiazide (AVALIDE) 300-25 MG per tablet Take 1 tablet by mouth daily. 30 tablet 1  . lisinopril (PRINIVIL,ZESTRIL) 10 MG tablet Take 1 tablet (10 mg total) by mouth daily. 30 tablet 1  . losartan (COZAAR) 25 MG tablet Take 25 mg by mouth daily.      Marland Kitchen. losartan (COZAAR) 25 MG tablet Take 1 tablet (25 mg total) by mouth daily. 30 tablet 1    Socially he is married; 5 children. 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 [employer]([place]). Now lives in [place]. No tobacco use.  Family History  Problem Relation Age of Onset  . Heart failure Father   . Hypertension Father   . Lung cancer Sister   . Pancreatic cancer Sister   . Cancer Sister   . Hypertension Mother    Family history is also notable that his mother is still living at age 56.  His father died at age 56 and had undergone CABG surgery at age 56.  He has 5 siblings and several sisters who have had supraventricular tachycardia.  ROS General: Negative; No fevers, chills, or night sweats HEENT: Negative; No changes in vision or hearing, sinus congestion,  difficulty swallowing Pulmonary: Negative; No cough, wheezing, shortness of breath, hemoptysis Cardiovascular:  See HPI;  GI: Negative; No nausea, vomiting, diarrhea, or abdominal pain GU: Negative; No dysuria, hematuria, or difficulty voiding Musculoskeletal: Negative; no myalgias, joint pain, or weakness Hematologic/Oncologic: Negative; no easy bruising, bleeding Endocrine: Negative; no heat/cold intolerance; no diabetes Neuro: Negative; no changes in balance, headaches Skin: Negative; No rashes or skin lesions Psychiatric: Negative; No behavioral problems, depression Sleep: Positive for loud snoring and according to wife witnessed apnea.  No daytime sleepiness, hypersomnolence, bruxism, restless legs, hypnogagnic hallucinations Other comprehensive 14 point system review is negative   Physical Exam BP 159/85 mmHg  Pulse 71  Temp(Src) 98.8 F (37.1 C)  Resp 17  Ht 6\' 1"  (1.854 m)  Wt 215 lb (97.523 kg)  BMI 28.37 kg/m2  SpO2 100% General: Alert, oriented, no distress.  Skin: normal turgor, no rashes, warm and dry HEENT: Normocephalic, atraumatic. Pupils equal round and reactive to light; sclera anicteric; extraocular muscles intact; Nose without nasal septal hypertrophy Mouth/Parynx benign; Mallinpatti scale 2/3 Neck: No JVD, no carotid bruits; normal carotid upstroke Lungs: clear to ausculatation and percussion; no wheezing or rales Chest wall: without tenderness to palpitation Heart: PMI not displaced, RRR, s1 s2 normal, 1/6 systolic murmur, no diastolic murmur, no rubs, gallops, thrills, or heaves Abdomen: soft, nontender; no hepatosplenomehaly, BS+; abdominal aorta nontender and not dilated by palpation. Back: no CVA tenderness Pulses 2+ Musculoskeletal: full range of motion, normal strength, no joint deformities Extremities: no clubbing cyanosis or edema, Homan's sign negative  Neurologic: grossly nonfocal; Cranial nerves grossly wnl Psychologic: Normal mood and  affect   ECG (independently read by me): NSR at 66; no acute changes  LABS:  BMET    Component Value Date/Time   NA 140 01/29/2014 0516   K 3.7 01/29/2014 0516   CL 99 01/29/2014 0516   CO2 29 01/29/2014 0516   GLUCOSE 106* 01/29/2014 0516   BUN 12 01/29/2014 0516   CREATININE 1.05 01/29/2014 0516   CALCIUM 9.3 01/29/2014 0516   GFRNONAA 78* 01/29/2014 0516   GFRAA 90* 01/29/2014 0516     Hepatic Function Panel     Component Value Date/Time   PROT 7.3 01/29/2014 0713   ALBUMIN 3.7 01/29/2014 0713   AST 22 01/29/2014 0713   ALT 21 01/29/2014 0713   ALKPHOS 66 01/29/2014 0713   BILITOT 0.4 01/29/2014 0713   BILIDIR PENDING 01/29/2014 0713   IBILI PENDING 01/29/2014 0713     CBC    Component Value Date/Time   WBC 5.6 01/29/2014 0516   RBC 4.77 01/29/2014 0516   HGB 13.8 01/29/2014 0516   HCT 41.3 01/29/2014 0516   PLT 233 01/29/2014 0516   MCV 86.6 01/29/2014 0516   MCH 28.9 01/29/2014 0516   MCHC 33.4 01/29/2014 0516   RDW 13.1 01/29/2014 0516   LYMPHSABS 2.0 01/29/2014 0516  MONOABS 0.5 01/29/2014 0516   EOSABS 0.1 01/29/2014 0516   BASOSABS 0.0 01/29/2014 0516     BNP    Component Value Date/Time   PROBNP <30.0 04/16/2007 0253    Lipid Panel     Component Value Date/Time   CHOL  04/16/2007 0253    188        ATP III CLASSIFICATION:  <200     mg/dL   Desirable  098-119  mg/dL   Borderline High  >=147    mg/dL   High   TRIG 829 56/21/3086 0253   HDL 28* 04/16/2007 0253   CHOLHDL 6.7 04/16/2007 0253   VLDL 21 04/16/2007 0253   LDLCALC * 04/16/2007 0253    139        Total Cholesterol/HDL:CHD Risk Coronary Heart Disease Risk Table                     Men   Women  1/2 Average Risk   3.4   3.3      RADIOLOGY: Dg Chest 2 View  01/29/2014   CLINICAL DATA:  Nonsmoker awoke this morning with heaviness on chest and some difficulty breathing.  EXAM: CHEST  2 VIEW  COMPARISON:  For 03/2007  FINDINGS: The heart size and mediastinal contours  are within normal limits. Both lungs are clear. The visualized skeletal structures are unremarkable.  IMPRESSION: No active cardiopulmonary disease.   Electronically Signed   By: Burman Nieves M.D.   On: 01/29/2014 05:38     ASSESSMENT AND PLAN: 1. Unstable angina 2. Accelerated hypertension 3. Possible obstructive sleep apnea 4. History of GERD 5. History of spinal stenosis  Mr. Daubenspeck has a cardiac risk factor profile notable for long-standing history of hypertension and family history for heart disease but at old age with his father.  He was awakened this a.m. with severe substernal chest tightness from a deep sleep, associated with significant diaphoresis and clamminess with mild shortness of breath.  Presently, his pain has improved but there still residual mild chest tightness. Over the past week, he has noticed some vague chest tightness symptoms with activity, which he had contributed to indigestion. I am concerned that his current presenting symptoms may represent unstable angina and have recommended definitive cardiac catheterization for evaluation.  His ECG does not show acute ST segment changes.  He transiently became bradycardic earlier this morning with his chest tightness.  He also has had an accelerated blood pressure issue over the last several months.  If he does not have significant coronary obstructive disease, further evaluation of his aorta may be necessary to make certain his symptoms are not representative of dissection.  I'm also concerned that he may very well have obstructive sleep apnea, particularly with his witnessed loud snoring as well as apneic spells by his wife. It is very likely that his morning episode may have been precipitated by nocturnal desaturation associated with REM sleep.  Ultimately, he will be referred for a sleep study for further evaluation. I will start him on low-dose intravenous nitroglycerin.  With careful monitoring of heart rate and blood pressure.  Arrangements are being made to transfer him to [facility] cardiac catheterization laboratory for cardiac cardiac catheterization today.     Lennette Bihari, MD, [facility] 01/29/2014 9:32 AM

## 2014-01-29 NOTE — Progress Notes (Signed)
Patient received from [facility]. Report taken. 0.9NS infusing at 75cc/hr w/NTG drip at 555mcg/min. Wife in room. Consent signed.

## 2014-01-29 NOTE — Interval H&P Note (Signed)
Cath Lab Visit (complete for each Cath Lab visit)  Clinical Evaluation Leading to the Procedure:   ACS: Yes.    Non-ACS:    Anginal Classification: CCS III  Anti-ischemic medical therapy: Minimal Therapy (1 class of medications)  Non-Invasive Test Results: No non-invasive testing performed  Prior CABG: No previous CABG      History and Physical Interval Note:  01/29/2014 3:52 PM  Jeanell Sparrowobert Rolfson  has presented today for surgery, with the diagnosis of cp  The various methods of treatment have been discussed with the patient and family. After consideration of risks, benefits and other options for treatment, the patient has consented to  Procedure(s): LEFT HEART CATHETERIZATION WITH CORONARY ANGIOGRAM (N/A) as a surgical intervention .  The patient's history has been reviewed, patient examined, no change in status, stable for surgery.  I have reviewed the patient's chart and labs.  Questions were answered to the patient's satisfaction.     Lesleigh NoeSMITH III,HENRY W

## 2014-01-29 NOTE — Progress Notes (Signed)
TR BAND REMOVAL  LOCATION:    right radial  DEFLATED PER PROTOCOL:    Yes.    TIME BAND OFF / DRESSING APPLIED:    1815   SITE UPON ARRIVAL:    Level 0  SITE AFTER BAND REMOVAL:    Level 0  REVERSE ALLEN'S TEST:     positive  CIRCULATION SENSATION AND MOVEMENT:    Within Normal Limits   Yes.    COMMENTS:   Tolerated procedure

## 2014-01-29 NOTE — CV Procedure (Addendum)
     Left Heart Catheterization with Coronary Angiography  Report  Jason SparrowRobert West  58 y.o.  male 04-Feb-1956  Procedure Date: 01/29/2014 Referring Physician: Nicki Guadalajarahomas Kelly, M.D. Primary Cardiologist:: Nicki Guadalajarahomas Kelly, M.D.  INDICATIONS: . Negative cardiac markers and EKGsprolonged chest pain at rest  PROCEDURE: 1. Left heart catheterization; 2. Coronary angiography; 3. Left ventriculography  CONSENT:  The risks, benefits, and details of the procedure were explained in detail to the patient. Risks including death, stroke, heart attack, kidney injury, allergy, limb ischemia, bleeding and radiation injury were discussed.  The patient verbalized understanding and wanted to proceed.  Informed written consent was obtained.  PROCEDURE TECHNIQUE:  After Xylocaine anesthesia a 5 French Slender sheath was placed in the right radial artery with an angiocath and the modified Seldinger technique.  Coronary angiography was done using a 5 F 5 [place]French JR4 and JL 3.5 cm catheter.  Left ventriculography was done using the JR4 catheter and hand injection.   Digital images were reviewed attention was drawn to an area of tortuosity in the proximal circumflex that one view appeared to represent obstruction but in all other views appeared to be a kink cause by tortuosity.   CONTRAST:  Total of 90 cc.  COMPLICATIONS:  none   HEMODYNAMICS:  Aortic pressure 116/74 mmHg; LV pressure 123/2 mmHg; LVEDP 9 mmHg  ANGIOGRAPHIC DATA:   The left main coronary artery is normal.  The left anterior descending artery is is large in caliber and contains proximal luminal irregularities. Two large diagonal branches arise from the proximal to mid LAD. The LAD wraps around the apex..the mid and distal LAD contained luminal irregularities. The mid vessel after the second diagonal has some systolic compression  The left circumflex artery is tortuous. In the proximal vessel there is a kink/bend. In one view this appears to obstruct the  vessel by 50%. 2 obtuse marginals arise distal to this. Flow in the circumflex is normal..  The right coronary artery is dominant and very tortuous with the mid segment of kinking that in some views appears to obstruct the vessel by up to 30-50%.Marland Kitchen.   LEFT VENTRICULOGRAM:  Left ventricular angiogram was done in the 30 RAO projection and revealed no significant wall motion abnormality. Estimated EF 55-60%.   IMPRESSIONS:  1. Widely patent coronary arteries. Both the right and circumflex artery contained a region of kinking that in at least one view appeared to obstruct the vessel up to 50%. 2. Normal LV function   RECOMMENDATION:  No abnormality that could account for the patient's symptoms is noted on this study..Marland Kitchen

## 2014-01-29 NOTE — Plan of Care (Signed)
Problem: Consults Goal: Cardiac Cath Patient Education (See Patient Education module for education specifics.) Outcome: Completed/Met Date Met:  01/29/14 Goal: Skin Care Protocol Initiated - if Braden Score 18 or less If consults are not indicated, leave blank or document N/A Outcome: Completed/Met Date Met:  01/29/14  Problem: Phase I Progression Outcomes Goal: Pain controlled with appropriate interventions Outcome: Completed/Met Date Met:  01/29/14 Goal: Voiding-avoid urinary catheter unless indicated Outcome: Completed/Met Date Met:  01/29/14 Goal: Hemodynamically stable Outcome: Completed/Met Date Met:  01/29/14 Goal: Distal pulses equal to baseline Outcome: Completed/Met Date Met:  01/29/14 Goal: Vascular site scale level 0 - I Vascular Site Scale Level 0: No bruising/bleeding/hematoma Level I (Mild): Bruising/Ecchymosis, minimal bleeding/ooozing, palpable hematoma < 3 cm Level II (Moderate): Bleeding not affecting hemodynamic parameters, pseudoaneurysm, palpable hematoma > 3 cm Level III (Severe) Bleeding which affects hemodynamic parameters or retroperitoneal hemorrhage  Outcome: Completed/Met Date Met:  01/29/14

## 2014-01-29 NOTE — ED Notes (Signed)
Pt sts woke up in sweat around 4:00 this am, feeling short of breath, and chest tightness. Pt reports chest pain and tightness at this time, stating shortness of breath has somewhat resolved. Pt has hx of hypertension

## 2014-01-29 NOTE — ED Notes (Signed)
Pt's wife called out for help at 0620, this writer went into room to check on pt, he was sweaty, c/o nausea, heart rate on monitor was 40, and dropped down to 34. Dr Rhunette CroftNanavati was notified and he evaluated pt. Pads were placed, CBG checked (112). Within five minutes pt heart rate increased to 50's, pt reports nausea was resolving. Pt then transferred to Res A.

## 2014-01-29 NOTE — ED Notes (Signed)
Pt brady down to 45 states "feel hot, like I'm going to pass out". Dr. Rhunette CroftNanavati at bedside.

## 2014-01-29 NOTE — ED Notes (Signed)
Bed: WA03 Expected date:  Expected time:  Means of arrival:  Comments: 

## 2014-01-29 NOTE — ED Notes (Signed)
Spoke w/ [facility] and provided Pt information.  Informed that typically the Cath Lab will contact them when it become available.

## 2014-01-29 NOTE — Care Management Note (Addendum)
  Page 1 of 1   01/29/2014     10:20:23 PM CARE MANAGEMENT NOTE 01/29/2014  Patient:  [name],[name]   Account Number:  [id number]  Date Initiated:  01/29/2014  Documentation initiated by:  Donato SchultzHUTCHINSON,Guerin Lashomb  Subjective/Objective Assessment:   HTN     Action/Plan:   CM to follow for disposition needs   Anticipated DC Date:  01/30/2014   Anticipated DC Plan:  HOME/SELF CARE         Choice offered to / List presented to:             Status of service:  Completed, signed off Medicare Important Message given?   (If response is "NO", the following Medicare IM given date fields will be blank) Date Medicare IM given:   Medicare IM given by:   Date Additional Medicare IM given:   Additional Medicare IM given by:    Discharge Disposition:  HOME/SELF CARE  Per UR Regulation:  Reviewed for med. necessity/level of care/duration of stay  If discussed at Long Length of Stay Meetings, dates discussed:    Comments:  Skyra Crichlow RN, BSN, MSHL, CCM  Nurse - Case Manager,  ([phone])  [phone]  01/29/2014 Left Heart Catheterization with Coronary Angiography on 01/29/2014 Specialty Med Review:  None identified Dispo Plan:  Home / Self care.

## 2014-01-29 NOTE — ED Notes (Signed)
MD at bedside. Cardiology 

## 2014-01-29 NOTE — ED Provider Notes (Addendum)
CSN: [id number]     Arrival date & time 01/29/14  0439 History   First MD Initiated Contact with Patient 01/29/14 [phone]     Chief Complaint  Patient presents with  . Chest Pain     (Consider location/radiation/quality/duration/timing/severity/associated sxs/prior Treatment) HPI Comments: Pt comes in with cc of chest pain. Pt has hx of HTN. Comes in with cc of chest pain. Chest pain is midsternal, and described as tightness. The pain is midsternal, non radiating, and pressure like pain. Pt has associated nausea, and feels hot and also had dib. He has no hx of PE, DVT. He has no family hx of premature CAD. Denies any substance abuse or smoking.   Patient is a 58 y.o. male presenting with chest pain. The history is provided by the patient.  Chest Pain Associated symptoms: diaphoresis, nausea and shortness of breath   Associated symptoms: no abdominal pain and no cough     Past Medical History  Diagnosis Date  . Hypertension   . Reflux   . Spinal stenosis    Past Surgical History  Procedure Laterality Date  . Cholecystectomy     Family History  Problem Relation Age of Onset  . Heart failure Father   . Hypertension Father   . Lung cancer Sister   . Pancreatic cancer Sister   . Cancer Sister   . Hypertension Mother    History  Substance Use Topics  . Smoking status: Never Smoker   . Smokeless tobacco: Never Used  . Alcohol Use: No    Review of Systems  Constitutional: Positive for diaphoresis. Negative for activity change and appetite change.  Respiratory: Positive for shortness of breath. Negative for cough.   Cardiovascular: Positive for chest pain.  Gastrointestinal: Positive for nausea. Negative for abdominal pain.  Genitourinary: Negative for dysuria.  All other systems reviewed and are negative.     Allergies  Enalapril and Neurontin  Home Medications   Prior to Admission medications   Medication Sig Start Date End Date Taking? Authorizing Provider   naproxen sodium (ANAPROX) 220 MG tablet Take 440 mg by mouth 2 (two) times daily as needed (for pain.).   Yes Historical Provider, MD  olmesartan-hydrochlorothiazide (BENICAR HCT) 20-12.5 MG per tablet Take 1 tablet by mouth daily.   Yes Historical Provider, MD   BP 131/78 mmHg  Pulse 76  Temp(Src) 98.1 F (36.7 C) (Oral)  Resp 20  Ht 6\' 1"  (1.854 m)  Wt 215 lb (97.523 kg)  BMI 28.37 kg/m2  SpO2 100% Physical Exam  Constitutional: He is oriented to person, place, and time. He appears well-developed. No distress.  HENT:  Head: Normocephalic and atraumatic.  Eyes: Conjunctivae and EOM are normal. Pupils are equal, round, and reactive to light.  Neck: Normal range of motion. Neck supple.  Cardiovascular: Normal rate, regular rhythm and intact distal pulses.   Pulmonary/Chest: Effort normal and breath sounds normal. No respiratory distress.  Abdominal: Soft. Bowel sounds are normal. He exhibits no distension. There is no tenderness. There is no rebound and no guarding.  Neurological: He is alert and oriented to person, place, and time.  Skin: Skin is warm. He is diaphoretic.  Nursing note and vitals reviewed.   ED Course  Procedures (including critical care time) Labs Review Labs Reviewed  BASIC METABOLIC PANEL - Abnormal; Notable for the following:    Glucose, Bld 106 (*)    GFR calc non Af Amer 78 (*)    GFR calc Af Amer 90 (*)  All other components within normal limits  C-REACTIVE PROTEIN - Abnormal; Notable for the following:    CRP <0.5 (*)    All other components within normal limits  CBG MONITORING, ED - Abnormal; Notable for the following:    Glucose-Capillary 112 (*)    All other components within normal limits  CBC WITH DIFFERENTIAL  SEDIMENTATION RATE  HEPATIC FUNCTION PANEL  TROPONIN I  PROTIME-INR  I-STAT TROPOININ, ED    Imaging Review Dg Chest 2 View  01/29/2014   CLINICAL DATA:  Nonsmoker awoke this morning with heaviness on chest and some difficulty  breathing.  EXAM: CHEST  2 VIEW  COMPARISON:  For 03/2007  FINDINGS: The heart size and mediastinal contours are within normal limits. Both lungs are clear. The visualized skeletal structures are unremarkable.  IMPRESSION: No active cardiopulmonary disease.   Electronically Signed   By: Burman NievesWilliam  Stevens M.D.   On: 01/29/2014 05:38     EKG Interpretation None      Date: 01/29/2014  Rate: 66  Rhythm: normal sinus rhythm  QRS Axis: normal  Intervals: normal  ST/T Wave abnormalities: nonspecific ST/T changes  Conduction Disutrbances:none  Narrative Interpretation:   Old EKG Reviewed: unchanged  POSTERIOR EKG Rate: 50  Rhythm: normal sinus rhythm  QRS Axis: normal  Intervals: normal  ST/T Wave abnormalities: nonspecific ST/T changes  Conduction Disutrbances:none  Narrative Interpretation:   Old EKG Reviewed: unchanged  Rate: 47  Rhythm: normal sinus rhythm  QRS Axis: normal  Intervals: normal  ST/T Wave abnormalities: nonspecific ST/T changes  Conduction Disutrbances:none  Narrative Interpretation:   Old EKG Reviewed: unchanged   CRITICAL CARE Performed by: Derwood KaplanNanavati, Jaskirat Zertuche   Total critical care time: 60 minutes - frequent reassessments included.  Critical care time was exclusive of separately billable procedures and treating other patients.  Critical care was necessary to treat or prevent imminent or life-threatening deterioration.  Critical care was time spent personally by me on the following activities: development of treatment plan with patient and/or surrogate as well as nursing, discussions with consultants, evaluation of patient's response to treatment, examination of patient, obtaining history from patient or surrogate, ordering and performing treatments and interventions, ordering and review of laboratory studies, ordering and review of radiographic studies, pulse oximetry and re-evaluation of patient's condition.   MDM   Final diagnoses:  Angina pectoris   Sinus bradycardia  Ischemic chest pain    Pt comes in with cc of chest pain. Hx of HTN.   History  Highly suspicious 2   ECG  Normal 0   Age  56 - 65 years 1    Risk Factors 1 or 2 risk factors 1   Troponin  ? normal limit 0  Heart SCORE IS 4.   Initial trops neg. EKG shows no acute findings. Pt has persistent chest pain, ASA given. Holding Heparin for now. Vascular exam is normal. No risk factors for dissection, outside of BP, with normal bilateral vascular exam and no neurologic sx and normal Cr. Pt is having HR going down to 40s on occasion, and pt gets dizzy with that, no hypotension, and Pt's HR and BP responds immediately on it's own.  HR and BP now stable. Still has tightness, cant give him nitro as he had arrest with nitro several years ago. On that occasion, he had a full cardiac workup, including cath and it was neg. Cards consulted.  Derwood KaplanAnkit Rooney Swails, MD 01/29/14 [id number]    Derwood KaplanAnkit Geanna Divirgilio, MD 01/29/14 2244

## 2014-01-30 ENCOUNTER — Observation Stay (HOSPITAL_COMMUNITY): Payer: BC Managed Care – PPO

## 2014-01-30 ENCOUNTER — Encounter (HOSPITAL_COMMUNITY): Payer: Self-pay | Admitting: Cardiology

## 2014-01-30 ENCOUNTER — Other Ambulatory Visit (HOSPITAL_COMMUNITY): Payer: Self-pay | Admitting: Cardiology

## 2014-01-30 DIAGNOSIS — R55 Syncope and collapse: Secondary | ICD-10-CM | POA: Diagnosis present

## 2014-01-30 DIAGNOSIS — R079 Chest pain, unspecified: Secondary | ICD-10-CM

## 2014-01-30 DIAGNOSIS — R001 Bradycardia, unspecified: Secondary | ICD-10-CM

## 2014-01-30 DIAGNOSIS — G4733 Obstructive sleep apnea (adult) (pediatric): Secondary | ICD-10-CM

## 2014-01-30 MED ORDER — IOHEXOL 350 MG/ML SOLN
80.0000 mL | Freq: Once | INTRAVENOUS | Status: AC | PRN
  Administered 2014-01-30: 10:00:00 80 mL via INTRAVENOUS

## 2014-01-30 MED ORDER — AMLODIPINE BESYLATE 5 MG PO TABS: 5.0000 mg | ORAL_TABLET | Freq: Every day | ORAL | Status: DC

## 2014-01-30 MED ORDER — HYDROCHLOROTHIAZIDE 12.5 MG PO CAPS
12.5000 mg | ORAL_CAPSULE | Freq: Every day | ORAL | Status: DC
  Administered 2014-01-30: 12.5 mg via ORAL
  Filled 2014-01-30: qty 1

## 2014-01-30 MED ORDER — ASPIRIN 81 MG PO CHEW: 81.0000 mg | CHEWABLE_TABLET | Freq: Every day | ORAL | Status: DC

## 2014-01-30 MED ORDER — ACETAMINOPHEN 325 MG PO TABS: 650.0000 mg | ORAL_TABLET | ORAL | Status: DC | PRN

## 2014-01-30 MED ORDER — IRBESARTAN 300 MG PO TABS
300.0000 mg | ORAL_TABLET | Freq: Every day | ORAL | Status: DC
  Administered 2014-01-30: 11:00:00 300 mg via ORAL
  Filled 2014-01-30: qty 1

## 2014-01-30 MED ORDER — PANTOPRAZOLE SODIUM 40 MG PO TBEC: 40.0000 mg | DELAYED_RELEASE_TABLET | Freq: Every day | ORAL | Status: DC

## 2014-01-30 NOTE — Discharge Instructions (Signed)
You will need event monitor to follow HR- for 30 days  01/31/14 at 3:30 pm  [facility] 2 [facility] floor    You will need sleep study with history of chest pain at night with HTN, periods of apnea.  Scheduled for 02/13/14 at 8:45 pm at [facility].  Sleep disorder center, call [phone] for directions.    Heart Healthy Diet.  We added a reflux medication to your meds to see if this will help prevent chest pain, may have been reflux at night.   Call if further problems.  [phone]

## 2014-01-30 NOTE — Discharge Summary (Signed)
Physician Discharge Summary       Patient ID: Jason SparrowRobert West MRN: [id number] DOB/AGE: [birth year] 56 y.o.  Admit date: 01/29/2014 Discharge date: 01/30/2014  Discharge Diagnoses:  Principal Problem:   Angina pectoris, non cardiac, neg. MI possible due to elevated BP Active Problems:   Chest pain   Sinus bradycardia   Accelerated hypertension   Chest pain at rest   Near syncope   Discharged Condition: good  Procedures: 01/29/14 cardiac cath by Dr. Alicia AmelSmith  Hospital Course:  58 y.o. male who has a long-standing history of hypertension for at least 15 years. Over the past several months he has had accelerated blood pressure with reported blood pressure at times in excess of 200 mm systolic. He has been followed by his primary care physician who has made medication adjustment. Over the past week, he has noticed a sensation of vague chest tightness which he initially had not paid much attention to during periods of increased activity. At approximately 4 AM 01/29/14 in the morning he was awakened from a deep sleep with significant substernal chest pressure, associated with diaphoresis and mild shortness of breath. [facility]. ECG did not reveal acute ST segment changes. He has continued to experience mild residual chest tightness and has had episodes of bradycardia with heart rates dropping down into the low 40s. Cardiology evaluation was recommended. Presently still notes mild residual chest ache and his blood pressure is 164/90.  Remotely, the patient states approximately 10 years ago he was given a sl nitroglycerin pill in which she became bradycardic shortly after taking the second dose. [facility] his HR dropped to 45 and he became weak and felt as if he would pass out.  HR did drop to 35.  Possible vasovagal.  He has had bradycardia before.  Pt also with apnea per his wife at night with sleep and loud snoring.     Pt admitted with  IV NTG and cardiac cath.  Cath was with patent coronary arteries.  Both the right and circumflex artery contained a region of kinking that in at least one view appeared to obstruct the vessel up to 50%.  He had normal LV function.  By the next AM no further complaints.  He and his wife anxious about the bradycardia.  Plan sleep study as outpt and 30 day event monitor to further evaluate.  CT angio done prior to discharge to rule out aortic dissection and PE.   Pt seen and examined by Dr. Tresa EndoKelly and found stable for discharge.   We will add PPI as reflux could be playing a role.  Pt aware of CT results at discharge, follow up as instructed.  No BB due to bradycardia.   Consults: None  Significant Diagnostic Studies:  BMET    Component Value Date/Time   NA 140 01/29/2014 0516   K 3.7 01/29/2014 0516   CL 99 01/29/2014 0516   CO2 29 01/29/2014 0516   GLUCOSE 106* 01/29/2014 0516   BUN 12 01/29/2014 0516   CREATININE 1.05 01/29/2014 0516   CALCIUM 9.3 01/29/2014 0516   GFRNONAA 78* 01/29/2014 0516   GFRAA 90* 01/29/2014 0516    CBC    Component Value Date/Time   WBC 5.6 01/29/2014 0516   RBC 4.77 01/29/2014 0516   HGB 13.8 01/29/2014 0516   HCT 41.3 01/29/2014 0516   PLT 233 01/29/2014 0516   MCV 86.6 01/29/2014 0516   MCH 28.9 01/29/2014 0516  MCHC 33.4 01/29/2014 0516   RDW 13.1 01/29/2014 0516   LYMPHSABS 2.0 01/29/2014 0516   MONOABS 0.5 01/29/2014 0516   EOSABS 0.1 01/29/2014 0516   BASOSABS 0.0 01/29/2014 0516    Troponin <0.30  LFTs WNL  EXAM: CHEST 2 VIEW COMPARISON: For 03/2007 FINDINGS: The heart size and mediastinal contours are within normal limits. Both lungs are clear. The visualized skeletal structures are unremarkable. IMPRESSION: No active cardiopulmonary disease.  CT ANGIO Chest:  FINDINGS: Opacification of the pulmonary arteries is good. No filling defects within either main pulmonary artery or their branches in either lung to suggest  pulmonary embolism. Heart size upper normal to slightly enlarged with left ventricular predominance and borderline to mild left ventricular hypertrophy. No pericardial effusion. 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  Low lung volumes with mild atelectasis in the lower lobes as a result. Lungs otherwise clear. No evidence of interstitial pulmonary edema. No confluent airspace consolidation. No pulmonary parenchymal nodules or masses. No pleural effusions. Central airways patent without significant bronchial wall thickening.  No significant mediastinal, hilar, or axillary lymphadenopathy. Visualized thyroid gland unremarkable.  Approximate 2.1 cm diameter simple cyst in the anterior segment right lobe of liver. Prior cholecystectomy. Visualized upper abdomen otherwise unremarkable. Bone window images unremarkable.  Review of the MIP images confirms the above findings.  IMPRESSION: 1. No evidence of pulmonary embolism. 2. No acute cardiopulmonary disease. 3. Borderline heart size with borderline to mild left ventricular hypertrophy. 4. 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 5. Benign 2 cm simple cyst in the anterior segment right lobe of liver.  EKG: Sinus rhythm- rate 66 Anteroseptal infarct, age indeterminate   Discharge Exam: Blood pressure 128/71, pulse 64, temperature 97.8 F (36.6 C), temperature source Oral, resp. rate 20, height 6\' 1"  (1.854 m), weight 216 lb 0.8 oz (98 kg), SpO2 100 %.   Disposition: 01-Home or Self Care     Medication List    TAKE these medications        acetaminophen 325 MG tablet  Commonly known as:  TYLENOL  Take 2 tablets (650 mg total) by mouth every 4 (four) hours as needed for headache or mild pain.     amLODipine 5 MG tablet  Commonly  known as:  NORVASC  Take 1 tablet (5 mg total) by mouth daily.     aspirin 81 MG chewable tablet  Chew 1 tablet (81 mg total) by mouth daily.     naproxen sodium 220 MG tablet  Commonly known as:  ANAPROX  Take 440 mg by mouth 2 (two) times daily as needed (for pain.).     olmesartan-hydrochlorothiazide 20-12.5 MG per tablet  Commonly known as:  BENICAR HCT  Take 1 tablet by mouth daily.     pantoprazole 40 MG tablet  Commonly known as:  PROTONIX  Take 1 tablet (40 mg total) by mouth daily.       Follow-up Information    [facility] On 02/17/2014.   Specialty:  Cardiology   Why:  at 9:30 AM    Contact information:   [address] [place] [address] [phone]       Follow up with Lennette BihariKELLY,THOMAS A, MD On 02/26/2014.   Specialty:  Cardiology   Why:  at 4:00 PM   Contact information:   [address] [place] [address] [phone]        Discharge Instructions: You will need event monitor to follow HR- for 30 days  01/31/14 at 3:30 pm  At the [facility] office on 2 nd floor    You will need sleep study with history of chest pain at night with HTN, periods of apnea.  Scheduled for 02/13/14 at 8:45 pm at [facility].  Sleep disorder center, call [phone] for directions.    Heart Healthy Diet.  We added a reflux medication to your meds to see if this will help prevent chest pain, may have been reflux at night.   Call if further problems.  [phone]   Signed: Leone Brand Nurse Practitioner-Certified [facility]: HEARTCARE 01/30/2014, 10:23 AM  Time spent on discharge : >35 minutes.

## 2014-01-30 NOTE — Progress Notes (Signed)
Subjective: No complaints, concern for the bradycardia with associated near syncope  Objective: Vital signs in last 24 hours: Temp:  [97.8 F (36.6 C)-98.3 F (36.8 C)] 97.8 F (36.6 C) (11/05 0609) Pulse Rate:  [63-89] 64 (11/05 0609) Resp:  [14-20] 20 (11/05 0609) BP: (123-159)/(57-103) 128/71 mmHg (11/05 0609) SpO2:  [97 %-100 %] 100 % (11/05 0609) Weight:  [216 lb 0.8 oz (98 kg)] 216 lb 0.8 oz (98 kg) (11/05 0007) Weight change: 1 lb 0.8 oz (0.477 kg) Last BM Date: 01/28/14 Intake/Output from previous day: +360 11/04 0701 - 11/05 0700 In: 660 [P.O.:360; I.V.:300] Out: 300 [Urine:300] Intake/Output this shift: Total I/O In: 100 [I.V.:100] Out: 300 [Urine:300]  PE: General:Pleasant affect, NAD Skin:Warm and dry, brisk capillary refill HEENT:normocephalic, sclera clear, mucus membranes moist Neck:supple, no JVD, no bruits  Heart:S1S2 RRR without murmur, gallup, rub or click Lungs:clear without rales, rhonchi, or wheezes UJW:JXBJAbd:soft, non tender, + BS, do not palpate liver spleen or masses Ext:no lower ext edema, 2+ pedal pulses, 2+ radial pulses Neuro:alert and oriented, MAE, follows commands, + facial symmetry   Lab Results:  Recent Labs  01/29/14 0516  WBC 5.6  HGB 13.8  HCT 41.3  PLT 233   BMET  Recent Labs  01/29/14 0516  NA 140  K 3.7  CL 99  CO2 29  GLUCOSE 106*  BUN 12  CREATININE 1.05  CALCIUM 9.3    Recent Labs  01/29/14 0713  TROPONINI <0.30    Lab Results  Component Value Date   CHOL  04/16/2007    188        ATP III CLASSIFICATION:  <200     mg/dL   Desirable  [id number]  mg/dL   Borderline High  >=621>=240    mg/dL   High   HDL 28* 30/86/578401/19/2009   LDLCALC * 04/16/2007    139        Total Cholesterol/HDL:CHD Risk Coronary Heart Disease Risk Table                     Men   Women  1/2 Average Risk   3.4   3.3   TRIG 104 04/16/2007   CHOLHDL 6.7 04/16/2007   No results found for: HGBA1C     Hepatic Function  Panel  Recent Labs  01/29/14 0713  PROT 7.3  ALBUMIN 3.7  AST 22  ALT 21  ALKPHOS 66  BILITOT 0.4  BILIDIR <0.2  IBILI NOT CALCULATED   No results for input(s): CHOL in the last 72 hours. No results for input(s): PROTIME in the last 72 hours.     Studies/Results: Dg Chest 2 View  01/29/2014   CLINICAL DATA:  Nonsmoker awoke this morning with heaviness on chest and some difficulty breathing.  EXAM: CHEST  2 VIEW  COMPARISON:  For 03/2007  FINDINGS: The heart size and mediastinal contours are within normal limits. Both lungs are clear. The visualized skeletal structures are unremarkable.  IMPRESSION: No active cardiopulmonary disease.   Electronically Signed   By: Burman NievesWilliam  Stevens M.D.   On: 01/29/2014 05:38   Cardiac cath: 1. Widely patent coronary arteries. Both the right and circumflex artery contained a region of kinking that in at least one view appeared to obstruct the vessel up to 50%. 2. Normal LV function  Medications: I have reviewed the patient's current medications. Scheduled Meds: . amLODipine  5 mg Oral Daily  . aspirin  81 mg  Oral Daily  . irbesartan  300 mg Oral Daily   And  . hydrochlorothiazide  25 mg Oral Daily   Continuous Infusions:  PRN Meds:.acetaminophen, ondansetron (ZOFRAN) IV, oxyCODONE-acetaminophen  Assessment/Plan: 58 y.o. male who has a long-standing history of hypertension for at least 15 years. Over the past several months he has had accelerated blood pressure with reported blood pressure at times in excess of 200 mm systolic. He has been followed by his primary care physician who has made medication adjustment. Over the past week, he has noticed a sensation of vague chest tightness which he initially had not paid much attention to during periods of increased activity. At approximately 4 AM this morning he was awakened from a deep sleep with significant substernal chest pressure, associated with diaphoresis and mild shortness of breath. He  ultimately [facility]. ECG did not reveal acute ST segment changes. He has continued to experience mild residual chest tightness and has had episodes of bradycardia with heart rates dropping down into the low 40s.Pt admitted and underwent cath.  Principal Problem:   Angina pectoris- neg MI, but with area of kinking obstructing the LCX to 50%.  Active Problems:   Chest pain   Accelerated hypertension- BP stable here   Chest pain at rest- resolved- negative MI   Bradycardia with near syncope in ER, but HR here in 60-80s Though [facility] was down to 35 that I find noted in chart, his wife said the HR was down to 20 and they brought crash cart.  The episodes occurred with pain.  He did become nauseated and hot feeling at 45 BPM.  But his HR did drop to 35.  Poss. Vasovagal.    ?30 day event monitor vs. Loop recorder.  Ambulate with plan for discharge.    LOS: 1 day   Time spent with pt. :15 minutes. [facility]LA[facility]A R  Nurse Practitioner Certified Pager [phone] or after 5pm and on weekends call [phone] 01/30/2014, 6:41 AM   Patient seen and examined. Agree with assessment and plan. I have reviewed angios. Mild kink in Lcx on sharp angle. Continue amlodipine, ARB and reduce dose of HCTZ to 12.5 mg. With 2 mo h/o accelerated BP will check CT today to assess aorta. Plan 30 day outpatient event monitor and sleep study with f/u with me as outpatient.   Lennette Biharihomas A. Lelan Cush, MD, [facility] 01/30/2014 9:02 AM

## 2014-01-31 ENCOUNTER — Ambulatory Visit: Payer: Self-pay | Admitting: *Deleted

## 2014-01-31 DIAGNOSIS — R55 Syncope and collapse: Secondary | ICD-10-CM

## 2014-01-31 DIAGNOSIS — T671XXS Heat syncope, sequela: Secondary | ICD-10-CM

## 2014-01-31 DIAGNOSIS — I498 Other specified cardiac arrhythmias: Secondary | ICD-10-CM

## 2014-01-31 NOTE — Progress Notes (Signed)
Utilization review completed- retro 

## 2014-02-13 ENCOUNTER — Ambulatory Visit: Payer: BC Managed Care – PPO | Admitting: Cardiovascular Disease

## 2014-02-13 ENCOUNTER — Other Ambulatory Visit: Payer: Self-pay | Admitting: *Deleted

## 2014-02-13 DIAGNOSIS — I10 Essential (primary) hypertension: Secondary | ICD-10-CM

## 2014-02-13 DIAGNOSIS — R0683 Snoring: Secondary | ICD-10-CM

## 2014-02-17 ENCOUNTER — Ambulatory Visit (HOSPITAL_BASED_OUTPATIENT_CLINIC_OR_DEPARTMENT_OTHER): Payer: BC Managed Care – PPO | Attending: Cardiology | Admitting: Radiology

## 2014-02-17 ENCOUNTER — Ambulatory Visit: Payer: BC Managed Care – PPO | Admitting: Cardiology

## 2014-02-17 VITALS — Ht 73.0 in | Wt 210.0 lb

## 2014-02-17 DIAGNOSIS — I10 Essential (primary) hypertension: Secondary | ICD-10-CM

## 2014-02-17 DIAGNOSIS — Z6827 Body mass index (BMI) 27.0-27.9, adult: Secondary | ICD-10-CM | POA: Insufficient documentation

## 2014-02-17 DIAGNOSIS — R51 Headache: Secondary | ICD-10-CM | POA: Insufficient documentation

## 2014-02-17 DIAGNOSIS — G4730 Sleep apnea, unspecified: Secondary | ICD-10-CM | POA: Insufficient documentation

## 2014-02-17 DIAGNOSIS — R0683 Snoring: Secondary | ICD-10-CM | POA: Insufficient documentation

## 2014-02-26 ENCOUNTER — Ambulatory Visit: Payer: BC Managed Care – PPO | Admitting: Cardiovascular Disease

## 2014-03-03 ENCOUNTER — Telehealth: Payer: Self-pay | Admitting: Cardiology

## 2014-03-03 NOTE — Telephone Encounter (Signed)
[employer] called to verify insurance on file. Gave last updated information.

## 2014-03-05 ENCOUNTER — Encounter: Payer: Self-pay | Admitting: Cardiology

## 2014-03-05 ENCOUNTER — Ambulatory Visit (INDEPENDENT_AMBULATORY_CARE_PROVIDER_SITE_OTHER): Payer: BC Managed Care – PPO | Admitting: Cardiology

## 2014-03-05 VITALS — BP 123/79 | HR 76 | Ht 73.0 in | Wt 216.4 lb

## 2014-03-05 DIAGNOSIS — I10 Essential (primary) hypertension: Secondary | ICD-10-CM

## 2014-03-05 DIAGNOSIS — R001 Bradycardia, unspecified: Secondary | ICD-10-CM

## 2014-03-05 MED ORDER — METOPROLOL TARTRATE 25 MG PO TABS: 25.0000 mg | ORAL_TABLET | Freq: Two times a day (BID) | ORAL | Status: DC

## 2014-03-05 NOTE — Patient Instructions (Signed)
CHANGE LOPRESSOR 25mg  twice daily  [facility]

## 2014-03-05 NOTE — Assessment & Plan Note (Signed)
No further episodes on event monitor and back on BB.

## 2014-03-05 NOTE — Assessment & Plan Note (Signed)
Stable today, i did ask pt to take lopressor at 25 mg BID.

## 2014-03-05 NOTE — Progress Notes (Signed)
03/05/2014   PCP: Ardyth ManPARK,CHAN M, MD   Chief Complaint  Patient presents with  . Follow-up    pt denies chest pain, sob, and swelling    Primary Cardiologist:Dr. Bishop Limbo. Kelly   HPI:  58 y.o. male who has a long-standing history of hypertension for at least 15 years. Over the past several months he has had accelerated blood pressure with reported blood pressure at times in excess of 200 mm systolic. He has been followed by his primary care physician who has made medication adjustment. He then had chest tightness and was seen in ER and [facility].  His HR had dropped to 40s as well.  His cath with patent coronary arteries. Both the right and circumflex artery contained a region of kinking that in at least one view appeared to obstruct the vessel up to 50%. He had normal LV function.  He was discharged off BB due to bradycardia.  This was restarted as outpt by PCP as pt could not afford the ARB.  He wore an event monitor without any brady noted, this is while he was on BB.    He has had a sleep study, results not yet available.  He does have follow up with Dr. Bishop Limbo. Kelly to eval his previous symptoms and follow up with sleep apnea.  Today he is back for follow up.  No complaints -has done well.  No chest pain.  We had added PPI prior to discharge.  He did state on checking BP one day it was 140/90.  Allergies  Allergen Reactions  . Enalapril Cough  . Neurontin [Gabapentin] Palpitations    Current Outpatient Prescriptions  Medication Sig Dispense Refill  . acetaminophen (TYLENOL) 325 MG tablet Take 2 tablets (650 mg total) by mouth every 4 (four) hours as needed for headache or mild pain.    Marland Kitchen. amLODipine (NORVASC) 5 MG tablet Take 1 tablet (5 mg total) by mouth daily. 30 tablet 6  . aspirin 81 MG chewable tablet Chew 1 tablet (81 mg total) by mouth daily.    . naproxen sodium (ANAPROX) 220 MG tablet Take 440 mg by mouth 2 (two) times daily as needed (for pain.).    Marland Kitchen.  pantoprazole (PROTONIX) 40 MG tablet Take 1 tablet (40 mg total) by mouth daily. 30 tablet 2  . metoprolol tartrate (LOPRESSOR) 25 MG tablet Take 1 tablet (25 mg total) by mouth 2 (two) times daily. 180 tablet 3   No current facility-administered medications for this visit.    Past Medical History  Diagnosis Date  . Hypertension   . Reflux   . Spinal stenosis   . S/P cardiac cath 01/29/14     but with area of kinking obstructing the LCX to 50%. other wise patent coronary arteries.    Past Surgical History  Procedure Laterality Date  . Cholecystectomy    . Cardiac catheterization  01/29/14    patent coronary arteries  but with area of kinking obstructing the LCX to 50%.     ZOX:WRUEAVW:UJROS:General:no colds or fevers, no weight changes Skin:no rashes or ulcers HEENT:no blurred vision, no congestion CV:see HPI PUL:see HPI GI:no diarrhea constipation or melena, no indigestion GU:no hematuria, no dysuria MS:no joint pain, no claudication Neuro:no syncope, no lightheadedness Endo:no diabetes, no thyroid disease  Wt Readings from Last 3 Encounters:  03/05/14 216 lb 6.4 oz (98.158 kg)  02/17/14 210 lb (95.255 kg)  01/30/14 216 lb 0.8 oz (98 kg)  PHYSICAL EXAM BP 123/79 mmHg  Pulse 76  Ht 6\' 1"  (1.854 m)  Wt 216 lb 6.4 oz (98.158 kg)  BMI 28.56 kg/m2 General:Pleasant affect, NAD Skin:Warm and dry, brisk capillary refill HEENT:normocephalic, sclera clear, mucus membranes moist Neck:supple, no JVD, no bruits  Heart:S1S2 RRR without murmur, gallup, rub or click Lungs:clear without rales, rhonchi, or wheezes ZOX:WRUEAbd:soft, non tender, + BS, do not palpate liver spleen or masses Ext:no lower ext edema, 2+ pedal pulses, 2+ radial pulses Neuro:alert and oriented, MAE, follows commands, + facial symmetry   Sleep study has been done  ASSESSMENT AND PLAN CHEST PAIN No further chest pain, PPI may have helped. Patent coronary arteries.    Sinus bradycardia No further episodes on event monitor  and back on BB.  Accelerated hypertension Stable today, i did ask pt to take lopressor at 25 mg BID.   Pt will continue to follow with PCP fo BP management and f/u with Dr. Bishop Limbo. Kelly.,

## 2014-03-05 NOTE — Assessment & Plan Note (Addendum)
No further chest pain, PPI may have helped. Patent coronary arteries.

## 2014-03-06 ENCOUNTER — Encounter (HOSPITAL_COMMUNITY): Payer: Self-pay | Admitting: Interventional Cardiology

## 2014-03-11 NOTE — Addendum Note (Signed)
Addended by: Nicki GuadalajaraKELLY, THOMAS A on: 03/11/2014 02:38 PM   Modules accepted: Level of Service

## 2014-03-11 NOTE — Sleep Study (Signed)
   NAME: Jason SparrowRobert Smouse DATE OF BIRTH:  02/22/1956 MEDICAL RECORD NUMBER [id number]  LOCATION: [facility]  PHYSICIAN: Sylena Lotter A  DATE OF STUDY: 02/17/2014  SLEEP STUDY TYPE: Nocturnal Polysomnogram               REFERRING PHYSICIAN: Leone BrandIngold, Laura R, NP  INDICATION FOR STUDY: Witnessed apnea, snoring, morning headaches, Frequent napping,  EPWORTH SLEEPINESS SCORE:   8 HEIGHT: 6\' 1"  (185.4 cm)  WEIGHT: 210 lb (95.255 kg)    Body mass index is 27.71 kg/(m^2).  NECK SIZE: 16 in.  MEDICATIONS:  Medications pantoprazole (PROTONIX) 40 MG tablet 40 mg, Daily naproxen sodium (ANAPROX) 220 MG tablet 440 mg, 2 times daily PRN metoprolol tartrate (LOPRESSOR) 25 MG tablet 25 mg, 2 times daily aspirin 81 MG chewable tablet 81 mg, Daily amLODipine (NORVASC) 5 MG tablet 5 mg, Daily acetaminophen (TYLENOL) 325 MG tablet 650 mg, Every 4 hours PRN  SLEEP ARCHITECTURE:    The recording began at 22:50:34.  Total sleep time was 269.5 minutes out of 341 minutes of sleep period time.  Percent sleep efficiency was 70.8%.  Sleep onset was 39 minutes.  Latency to REM sleep was prolonged at 169 minutes.  The patient spent 61.5 minutes in stage I (22.8%) 188 minutes in stage II (69.8%) 0 minutes in stage III, and 20 minutes in rem sleep (7.4%).  Sleep architecture was abnormal with absence of slow-wave sleep and delayed latency to REM sleep.  RESPIRATORY DATA:    During the sleep period time, the patient had 0 apneas, and 2 hypopneas.  The apnea plus hypotony index (AHI) was 0.4.  The respiratory disturbance index (RDI) was 8.2.  There were 190 spontaneous arousals with an index of 42.3 and the total arousal index was 42.7.    OXYGEN DATA:    The baseline oxygen saturation was 98%.  The lowest oxygen's saturation during non-REM sleep and REM sleep was 94%, and 94%, respectively.  Mild snoring was noted  CARDIAC DATA:    The average heart rate was 62 bpm.  There was a rare isolated  PAC.  MOVEMENT/PARASOMNIA:  There were 0 periodic limb movements.  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 Effort should be made as to optimize nasal and oral pharyngeal patency. No evidence for nocturnal myoclonus. Decrease in percentage of REM sleep. There is no indication for CPAP therapy.  Consider alternatives for the treatment of some mild snoring.  Lennette BihariKELLY,Natalyah Cummiskey A Diplomate, American Board of Sleep Medicine  ELECTRONICALLY SIGNED ON:  03/11/2014, 2:17 PM [facility] PH: [phone]   FX: [phone] ACCREDITED BY THE AMERICAN ACADEMY OF SLEEP MEDICINE

## 2014-03-13 ENCOUNTER — Telehealth: Payer: Self-pay | Admitting: *Deleted

## 2014-03-13 NOTE — Telephone Encounter (Signed)
Left message for patient to return a call to discuss sleep study. 

## 2014-03-13 NOTE — Telephone Encounter (Signed)
Patient returned a call to me. I informed him that his sleep study was negative for sleep apnea but does show some airway resistance. It is recommended that he keeps his appointment with Dr. Tresa EndoKelly to discuss the study in detail and make recommendations.

## 2014-03-29 ENCOUNTER — Other Ambulatory Visit: Payer: Self-pay | Admitting: Cardiology

## 2014-03-31 NOTE — Telephone Encounter (Signed)
Rx(s) sent to pharmacy electronically.  

## 2014-04-29 ENCOUNTER — Telehealth: Payer: Self-pay | Admitting: Cardiovascular Disease

## 2014-04-29 MED ORDER — AMLODIPINE BESYLATE 5 MG PO TABS: 5.0000 mg | ORAL_TABLET | Freq: Two times a day (BID) | ORAL | Status: DC

## 2014-04-29 NOTE — Telephone Encounter (Signed)
Increase norvasc to 10 mg daily  

## 2014-04-29 NOTE — Telephone Encounter (Signed)
Spoke with pt, aware of laura ingold's recommendations. New script sent to the pharmacy

## 2014-04-29 NOTE — Telephone Encounter (Signed)
Pt's wife called in stating that he has been having BP spikes this morning at 160/100 she would like to know if his medication needs to be adjusted. Please advise  Thanks

## 2014-04-29 NOTE — Telephone Encounter (Signed)
Spoke with pt, his bp last night after taking his meds was 150's/90's. This am 167/95 right after taking his meds. He does not have a way to check his bp at home. Patient reports feeling bad last night but feels fine today. Will forward for laura ingold's review

## 2014-04-29 NOTE — Telephone Encounter (Signed)
Left message for pt wife to call back

## 2014-04-30 ENCOUNTER — Other Ambulatory Visit: Payer: Self-pay | Admitting: Cardiology

## 2014-04-30 NOTE — Telephone Encounter (Signed)
Rx has been sent to the pharmacy electronically. ° °

## 2014-05-22 ENCOUNTER — Ambulatory Visit: Payer: BC Managed Care – PPO | Admitting: Cardiovascular Disease

## 2014-06-04 ENCOUNTER — Emergency Department (HOSPITAL_BASED_OUTPATIENT_CLINIC_OR_DEPARTMENT_OTHER)
Admission: EM | Admit: 2014-06-04 | Discharge: 2014-06-04 | Disposition: A | Discharge status: Home / Self Care | Payer: 59 | Attending: Emergency Medicine | Admitting: Emergency Medicine

## 2014-06-04 ENCOUNTER — Emergency Department (HOSPITAL_BASED_OUTPATIENT_CLINIC_OR_DEPARTMENT_OTHER): Payer: 59

## 2014-06-04 ENCOUNTER — Encounter (HOSPITAL_BASED_OUTPATIENT_CLINIC_OR_DEPARTMENT_OTHER): Payer: Self-pay

## 2014-06-04 DIAGNOSIS — Z9889 Other specified postprocedural states: Secondary | ICD-10-CM | POA: Diagnosis not present

## 2014-06-04 DIAGNOSIS — Z7982 Long term (current) use of aspirin: Secondary | ICD-10-CM | POA: Insufficient documentation

## 2014-06-04 DIAGNOSIS — J111 Influenza due to unidentified influenza virus with other respiratory manifestations: Secondary | ICD-10-CM | POA: Diagnosis not present

## 2014-06-04 DIAGNOSIS — R05 Cough: Secondary | ICD-10-CM | POA: Diagnosis present

## 2014-06-04 DIAGNOSIS — K219 Gastro-esophageal reflux disease without esophagitis: Secondary | ICD-10-CM | POA: Diagnosis not present

## 2014-06-04 DIAGNOSIS — Z79899 Other long term (current) drug therapy: Secondary | ICD-10-CM | POA: Diagnosis not present

## 2014-06-04 DIAGNOSIS — Z8739 Personal history of other diseases of the musculoskeletal system and connective tissue: Secondary | ICD-10-CM | POA: Insufficient documentation

## 2014-06-04 DIAGNOSIS — I10 Essential (primary) hypertension: Secondary | ICD-10-CM | POA: Insufficient documentation

## 2014-06-04 DIAGNOSIS — R69 Illness, unspecified: Secondary | ICD-10-CM

## 2014-06-04 LAB — BASIC METABOLIC PANEL
Anion gap: 8 (ref 5–15)
BUN: 13 mg/dL (ref 6–23)
CALCIUM: 9.2 mg/dL (ref 8.4–10.5)
CO2: 28 mmol/L (ref 19–32)
Chloride: 105 mmol/L (ref 96–112)
Creatinine, Ser: 1.31 mg/dL (ref 0.50–1.35)
GFR calc Af Amer: 69 mL/min — ABNORMAL LOW (ref >90)
GFR calc non Af Amer: 59 mL/min — ABNORMAL LOW (ref >90)
GLUCOSE: 101 mg/dL — AB (ref 70–99)
Potassium: 3.9 mmol/L (ref 3.5–5.1)
Sodium: 141 mmol/L (ref 135–145)

## 2014-06-04 LAB — CBC
HEMATOCRIT: 41 % (ref 39.0–52.0)
HEMOGLOBIN: 13.5 g/dL (ref 13.0–17.0)
MCH: 28.8 pg (ref 26.0–34.0)
MCHC: 32.9 g/dL (ref 30.0–36.0)
MCV: 87.4 fL (ref 78.0–100.0)
PLATELETS: 193 10*3/uL (ref 150–400)
RBC: 4.69 MIL/uL (ref 4.22–5.81)
RDW: 13.7 % (ref 11.5–15.5)
WBC: 5.3 10*3/uL (ref 4.0–10.5)

## 2014-06-04 MED ORDER — ACETAMINOPHEN 325 MG PO TABS
650.0000 mg | ORAL_TABLET | Freq: Once | ORAL | Status: AC
  Administered 2014-06-04: 650 mg via ORAL
  Filled 2014-06-04: qty 2

## 2014-06-04 MED ORDER — OSELTAMIVIR PHOSPHATE 75 MG PO CAPS: 75.0000 mg | ORAL_CAPSULE | Freq: Two times a day (BID) | ORAL | Status: DC

## 2014-06-04 MED ORDER — KETOROLAC TROMETHAMINE 30 MG/ML IJ SOLN
30.0000 mg | Freq: Once | INTRAMUSCULAR | Status: AC
  Administered 2014-06-04: 30 mg via INTRAVENOUS
  Filled 2014-06-04: qty 1

## 2014-06-04 MED ORDER — IBUPROFEN 800 MG PO TABS: 800.0000 mg | ORAL_TABLET | Freq: Three times a day (TID) | ORAL | Status: DC

## 2014-06-04 MED ORDER — ONDANSETRON HCL 4 MG/2ML IJ SOLN
4.0000 mg | Freq: Once | INTRAMUSCULAR | Status: AC
  Administered 2014-06-04: 4 mg via INTRAVENOUS
  Filled 2014-06-04: qty 2

## 2014-06-04 MED ORDER — SODIUM CHLORIDE 0.9 % IV BOLUS (SEPSIS)
1000.0000 mL | Freq: Once | INTRAVENOUS | Status: AC
  Administered 2014-06-04: 1000 mL via INTRAVENOUS

## 2014-06-04 NOTE — ED Notes (Signed)
[facility] & family.

## 2014-06-04 NOTE — Discharge Instructions (Signed)

## 2014-06-04 NOTE — ED Provider Notes (Signed)
CSN: [id number]     Arrival date & time 06/04/14  1827 History  This chart was scribed for Elwin MochaBlair Paolina Karwowski, MD by Chestine SporeSoijett Blue, ED Scribe. The patient was seen in room MH11/MH11 at 7:57 PM.     Chief Complaint  Patient presents with  . Cough      Patient is a 59 y.o. male presenting with cough. The history is provided by the patient. No language interpreter was used.  Cough Cough characteristics:  Nocturnal Severity:  Moderate Onset quality:  Sudden Duration:  1 day Timing:  Constant Progression:  Unchanged Chronicity:  New Context: sick contacts   Relieved by:  Nothing Associated symptoms: fever and rhinorrhea   Associated symptoms: no ear pain and no sore throat     HPI Comments: Jeanell SparrowRobert Zimmerle is a 59 y.o. male who presents to the Emergency Department complaining of cough onset yesterday morning. Pt initially began sneezing and his sinuses were draining and he attempted to treat it with OTC medication. Wife states that last night the pt fever was 102. He states that he is having associated symptoms of fever, weakness, rhinorrhea, nausea. He states that he has tried OTC medications with no relief for his symptoms. He denies generalized body aches, sore throat, vomiting, diarrhea, ear pain, and any other symptoms. Pt is retired and he notes that he has probable contact with sick contacts.    Past Medical History  Diagnosis Date  . Hypertension   . Reflux   . Spinal stenosis   . S/P cardiac cath 01/29/14     but with area of kinking obstructing the LCX to 50%. other wise patent coronary arteries.   Past Surgical History  Procedure Laterality Date  . Cholecystectomy    . Cardiac catheterization  01/29/14    patent coronary arteries  but with area of kinking obstructing the LCX to 50%.   . Left heart catheterization with coronary angiogram N/A 01/29/2014    Procedure: LEFT HEART CATHETERIZATION WITH CORONARY ANGIOGRAM;  Surgeon: Lesleigh NoeHenry W Smith III, MD;  [facility];  Service:  Cardiovascular;  Laterality: N/A;   Family History  Problem Relation Age of Onset  . Heart failure Father   . Hypertension Father   . Lung cancer Sister   . Pancreatic cancer Sister   . Cancer Sister   . Hypertension Mother    History  Substance Use Topics  . Smoking status: Never Smoker   . Smokeless tobacco: Never Used  . Alcohol Use: No    Review of Systems  Constitutional: Positive for fever.  HENT: Positive for rhinorrhea. Negative for ear pain and sore throat.   Respiratory: Positive for cough.   Gastrointestinal: Positive for nausea. Negative for vomiting and diarrhea.  Neurological: Positive for weakness.  All other systems reviewed and are negative.     Allergies  Enalapril and Neurontin  Home Medications   Prior to Admission medications   Medication Sig Start Date End Date Taking? Authorizing Provider  acetaminophen (TYLENOL) 325 MG tablet Take 2 tablets (650 mg total) by mouth every 4 (four) hours as needed for headache or mild pain. 01/30/14   Leone BrandLaura R Ingold, NP  amLODipine (NORVASC) 5 MG tablet Take 1 tablet (5 mg total) by mouth 2 (two) times daily. 04/29/14   Leone BrandLaura R Ingold, NP  aspirin 81 MG chewable tablet Chew 1 tablet (81 mg total) by mouth daily. 01/30/14   Leone BrandLaura R Ingold, NP  metoprolol tartrate (LOPRESSOR) 25 MG tablet Take 1 tablet (  25 mg total) by mouth 2 (two) times daily. 03/05/14   Leone Brand, NP  naproxen sodium (ANAPROX) 220 MG tablet Take 440 mg by mouth 2 (two) times daily as needed (for pain.).    Historical Provider, MD  pantoprazole (PROTONIX) 40 MG tablet TAKE 1 TABLET (40 MG TOTAL) BY MOUTH DAILY. 03/31/14   Lennette Bihari, MD  pantoprazole (PROTONIX) 40 MG tablet TAKE 1 TABLET (40 MG TOTAL) BY MOUTH DAILY. 04/30/14   Leone Brand, NP   BP 141/77 mmHg  Pulse 96  Temp(Src) 101 F (38.3 C) (Oral)  Resp 18  Ht  (1.854 m)  Wt 210 lb (95.255 kg)  BMI 27.71 kg/m2  SpO2 99%  Physical Exam  Constitutional: He is oriented to person,  place, and time. He appears well-developed and well-nourished. No distress.  HENT:  Head: Normocephalic and atraumatic.  Mouth/Throat: No oropharyngeal exudate.  Eyes: Pupils are equal, round, and reactive to light.  Neck: Normal range of motion. Neck supple.  Cardiovascular: Normal rate, regular rhythm and normal heart sounds.  Exam reveals no gallop and no friction rub.   No murmur heard. Pulmonary/Chest: Effort normal and breath sounds normal. No respiratory distress. He has no wheezes. He has no rales.  Abdominal: Soft. Bowel sounds are normal. He exhibits no distension and no mass. There is no tenderness. There is no rebound and no guarding.  Musculoskeletal: Normal range of motion. He exhibits no edema or tenderness.  Neurological: He is alert and oriented to person, place, and time.  Skin: Skin is warm and dry.  Psychiatric: He has a normal mood and affect.  Nursing note and vitals reviewed.   ED Course  Procedures (including critical care time) DIAGNOSTIC STUDIES: Oxygen Saturation is 99% on RA, normal by my interpretation.    COORDINATION OF CARE: 8:05 PM-Discussed treatment plan which includes CXR, tylenol, IV fluids, labs, zofran with pt at bedside and pt agreed to plan.   Labs Review Labs Reviewed - No data to display  Imaging Review Dg Chest 2 View  06/04/2014   CLINICAL DATA:  Sinus drainage with cough and fever since yesterday. Nonsmoker.  EXAM: CHEST  2 VIEW  COMPARISON:  01/29/2014  FINDINGS: The heart size and mediastinal contours are within normal limits. Both lungs are clear. The visualized skeletal structures are unremarkable.  IMPRESSION: No active cardiopulmonary disease.   Electronically Signed   By: Burman Nieves M.D.   On: 06/04/2014 19:00     EKG Interpretation None      MDM   Final diagnoses:  Influenza-like illness    59 year old male here with cough, fever, myalgias. Began yesterday. Denies any chest pain or shortness of breath. No nausea,  vomiting, diarrhea. No sick contacts. Here well-appearing, relaxing comfortably. No cervical lymphadenopathy. Neck is supple without meningeal signs. No oropharyngeal erythema or swelling. Lungs are clear. We'll check chest x-ray. I suspect probably of influenza-like illness. Will give fluids, Zofran, Toradol. On reassessment, he is feeling much better after fluids and medications. Will start on Tamiflu. Chest x-rays normal. Stable for discharge.  I personally performed the services described in this documentation, which was scribed in my presence. The recorded information has been reviewed and is accurate.     Elwin Mocha, MD 06/05/14 [phone]

## 2014-06-04 NOTE — ED Notes (Signed)
Pt alert, NAD, calm, interactive, resps e/u, speaking in clear complete sentences. Denies pain or sob, "hurst to cough", LS CTA.

## 2014-06-04 NOTE — ED Notes (Signed)
Reports cough, fever and weakness since yesterday.

## 2014-06-06 ENCOUNTER — Ambulatory Visit: Payer: BC Managed Care – PPO | Admitting: Cardiovascular Disease

## 2014-07-31 ENCOUNTER — Ambulatory Visit (INDEPENDENT_AMBULATORY_CARE_PROVIDER_SITE_OTHER): Payer: 59 | Admitting: Cardiovascular Disease

## 2014-07-31 ENCOUNTER — Encounter: Payer: Self-pay | Admitting: Cardiovascular Disease

## 2014-07-31 VITALS — BP 148/88 | HR 68 | Ht 73.0 in | Wt 214.7 lb

## 2014-07-31 DIAGNOSIS — R079 Chest pain, unspecified: Secondary | ICD-10-CM | POA: Diagnosis not present

## 2014-07-31 DIAGNOSIS — E785 Hyperlipidemia, unspecified: Secondary | ICD-10-CM | POA: Diagnosis not present

## 2014-07-31 DIAGNOSIS — I25810 Atherosclerosis of coronary artery bypass graft(s) without angina pectoris: Secondary | ICD-10-CM

## 2014-07-31 DIAGNOSIS — R002 Palpitations: Secondary | ICD-10-CM | POA: Diagnosis not present

## 2014-07-31 DIAGNOSIS — I10 Essential (primary) hypertension: Secondary | ICD-10-CM

## 2014-07-31 DIAGNOSIS — I2583 Coronary atherosclerosis due to lipid rich plaque: Secondary | ICD-10-CM

## 2014-07-31 DIAGNOSIS — I2510 Atherosclerotic heart disease of native coronary artery without angina pectoris: Secondary | ICD-10-CM

## 2014-07-31 MED ORDER — METOPROLOL TARTRATE 50 MG PO TABS: ORAL_TABLET | ORAL | Status: DC

## 2014-07-31 MED ORDER — NEBIVOLOL HCL 10 MG PO TABS: 5.0000 mg | ORAL_TABLET | Freq: Every day | ORAL | Status: DC

## 2014-07-31 NOTE — Patient Instructions (Addendum)
Your physician has recommended you make the following change in your medication: the lopressor has been changed to Bystolic. Start with 1/2 tablet daily. Monitor blood pressure. If still elevated, then increase Bystolic to 1 pill daily.  Your physician recommends that you return for lab work FASTING.  Your physician has requested that you have an echocardiogram. Echocardiography is a painless test that uses sound waves to create images of your heart. It provides your doctor with information about the size and shape of your heart and how well your heart's chambers and valves are working. This procedure takes approximately one hour. There are no restrictions for this procedure.  Your physician recommends that you schedule a follow-up appointment in: 2 months.

## 2014-08-02 ENCOUNTER — Encounter: Payer: Self-pay | Admitting: Cardiovascular Disease

## 2014-08-02 DIAGNOSIS — I10 Essential (primary) hypertension: Secondary | ICD-10-CM | POA: Insufficient documentation

## 2014-08-02 DIAGNOSIS — I2510 Atherosclerotic heart disease of native coronary artery without angina pectoris: Secondary | ICD-10-CM | POA: Insufficient documentation

## 2014-08-02 NOTE — Progress Notes (Signed)
Patient ID: Brighten Orndoff, male   DOB: Dec 01, 1955, 59 y.o.   MRN: [id number]     HPI: Timithy Arons is a 59 y.o. male who presents to the office today for a  cardiology evaluation following his hospitalization with chest pain in November 2015.  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  I had seen him in [place] emergency room when he presented on 01/29/2014 after being awakened from a deep sleep with substernal chest pressure associated with diaphoresis and mild shortness of breath.  He has a history of accelerated hypertension and previously it had blood pressures in excess of 200 bpm.  His ECG on presentation did not reveal acute ST segment changes.  He continue to experience residual episodes of chest pain and cardiology consultation was recommended.  His cardiac risk.  Risk factor notable for long-standing history of hypertension as well as family history for heart disease.  I recommended definitive cardiac catheterization.  Catheterization was done by Dr. Tamala Julian in both the right and circumflex coronary arteries contained a region of kinking that in one view appeared to obstructive vessel up to 50%.  Normal LV function.  Ejection fraction was 55-60%.  He was seen in follow-up of his hospitalization by Cecilie Kicks on 03/05/2014.  He did not experience recurrent chest pain.  He had been on a PPI.  He had had some bradycardia in the hospital and a subsequent event monitor did not reveal significant bradycardia arrhythmia and he was restarted back on metoprolol at 25 mg twice a day.  Due to concerns for sleep apnea particularly with his chest pain awakening him from sleep he underwent a sleep study on November 12 23 2015.  This suggested increased upper airway resistance syndrome but definitive sleep apnea was not demonstrated.  His AHI was 0.4; however, his RDI was 8.2.  He had an increased arousals.  There was no evidence for nocturnal myoclonus.  Resume, he states that at times  he becomes very hot when he wakes up.  At times he has noticed his heart rate speeding up.  He denies any exertionally precipitated chest tightness.  His current medical regimen consists of amlodipine 5 mg twice a day, metoprolol, tartrate 25 mg twice a day.  He has had some issues with erectile function on metoprolol.  He presents for evaluation.  Past Medical History  Diagnosis Date  . Hypertension   . Reflux   . Spinal stenosis   . S/P cardiac cath 01/29/14     but with area of kinking obstructing the LCX to 50%. other wise patent coronary arteries.    Past Surgical History  Procedure Laterality Date  . Cholecystectomy    . Cardiac catheterization  01/29/14    patent coronary arteries  but with area of kinking obstructing the LCX to 50%.   . Left heart catheterization with coronary angiogram N/A 01/29/2014    Procedure: LEFT HEART CATHETERIZATION WITH CORONARY ANGIOGRAM;  Surgeon: Sinclair Grooms, MD;  Location[facility] CATH LAB;  Service: Cardiovascular;  Laterality: N/A;    Allergies  Allergen Reactions  . Enalapril Cough  . Neurontin [Gabapentin] Palpitations    Current Outpatient Prescriptions  Medication Sig Dispense Refill  . acetaminophen (TYLENOL) 325 MG tablet Take 2 tablets (650 mg total) by mouth every 4 (four) hours as needed for headache or mild pain.    Marland Kitchen amLODipine (NORVASC) 5 MG tablet Take 1 tablet (5 mg total) by mouth 2 (two) times daily.  60 tablet 6  . aspirin 81 MG chewable tablet Chew 1 tablet (81 mg total) by mouth daily.    . naproxen sodium (ANAPROX) 220 MG tablet Take 440 mg by mouth 2 (two) times daily as needed (for pain.).    Marland Kitchen nebivolol (BYSTOLIC) 10 MG tablet Take 0.5 tablets (5 mg total) by mouth daily. 28 tablet 0   No current facility-administered medications for this visit.    History   Social History  . Marital Status: Married    Spouse Name: N/A  . Number of Children: N/A  . Years of Education: N/A   Occupational History  . Not on file.     Social History Main Topics  . Smoking status: Never Smoker   . Smokeless tobacco: Never Used  . Alcohol Use: No  . Drug Use: No  . Sexual Activity: Yes   Other Topics Concern  . Not on file   Social History Narrative   Social history is notable that he is married, has 5 children.  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  He now lives in [place].  There is no tobacco use.  Family History  Problem Relation Age of Onset  . Heart failure Father   . Hypertension Father   . Lung cancer Sister   . Pancreatic cancer Sister   . Cancer Sister   . Hypertension Mother    Family history is also notable that his mother is still living at age 31.  Father died at age 44 and had undergone CABG surgery at 90.  He has 5 siblings and several sisters who have had SVT.  One sister was recently diagnosed with adrenal gland issues.  ROS General: Negative; No fevers, chills, or night sweats HEENT: Negative; No changes in vision or hearing, sinus congestion, difficulty swallowing Pulmonary: Negative; No cough, wheezing, shortness of breath, hemoptysis Cardiovascular: See HPI: No chest pain, presyncope, syncope, palpatations GI: Negative; No nausea, vomiting, diarrhea, or abdominal pain GU: Negative; No dysuria, hematuria, or difficulty voiding Musculoskeletal: Negative; no myalgias, joint pain, or weakness Hematologic: Negative; no easy bruising, bleeding Endocrine: Negative; no heat/cold intolerance; no diabetes, Neuro: Negative; no changes in balance, headaches Skin: Negative; No rashes or skin lesions Psychiatric: Negative; No behavioral problems, depression Sleep: Negative; No snoring,  daytime sleepiness, hypersomnolence, bruxism, restless legs, hypnogognic hallucinations. Other comprehensive 14 point system review is negative   Physical Exam BP 148/88 mmHg  Pulse 68  Ht $R'6\' 1"'oO$  (1.854 m)  Wt 214 lb 11.2 oz (97.387 kg)  BMI 28.33 kg/m2 Wt Readings from Last 3  Encounters:  07/31/14 214 lb 11.2 oz (97.387 kg)  06/04/14 210 lb (95.255 kg)  03/05/14 216 lb 6.4 oz (98.158 kg)   General: Alert, oriented, no distress.  Skin: normal turgor, no rashes, warm and dry HEENT: Normocephalic, atraumatic. Pupils equal round and reactive to light; sclera anicteric; extraocular muscles intact, No lid lag; Nose without nasal septal hypertrophy; Mouth/Parynx benign; Mallinpatti scale 2/3 Neck: No JVD, no carotid bruits; normal carotid upstroke Lungs: clear to ausculatation and percussion bilaterally; no wheezing or rales, normal inspiratory and expiratory effort Chest wall: without tenderness to palpitation Heart: PMI not displaced, RRR, s1 s2 normal, 1/6 systolic murmur, No diastolic murmur, no rubs, gallops, thrills, or heaves Abdomen: soft, nontender; no hepatosplenomehaly, BS+; abdominal aorta nontender and not dilated by palpation. Back: no CVA tenderness Pulses: 2+  Musculoskeletal: full range of motion, normal strength, no joint deformities Extremities: Pulses 2+, no clubbing  cyanosis or edema, Homan's sign negative  Neurologic: grossly nonfocal; Cranial nerves grossly wnl Psychologic: Normal mood and affect   ECG (independently read by me): Normal sinus rhythm at 68 bpm.  Nonspecific ST changes in lead 3.  QTc interval 414 ms.  LABS:  BMP Latest Ref Rng 06/04/2014 01/29/2014 11/02/2013  Glucose 70 - 99 mg/dL 101(H) 106(H) 108(H)  BUN 6 - 23 mg/dL $Remove'13 12 8  'xpOPqxf$ Creatinine 0.50 - 1.35 mg/dL 1.31 1.05 0.98  Sodium 135 - 145 mmol/L 141 140 141  Potassium 3.5 - 5.1 mmol/L 3.9 3.7 3.7  Chloride 96 - 112 mmol/L 105 99 102  CO2 19 - 32 mmol/L $RemoveB'28 29 27  'jdsOBTPF$ Calcium 8.4 - 10.5 mg/dL 9.2 9.3 9.4     Hepatic Function Latest Ref Rng 01/29/2014 11/02/2013 05/01/2008  Total Protein 6.0 - 8.3 g/dL 7.3 7.4 7.4  Albumin 3.5 - 5.2 g/dL 3.7 3.9 4.1  AST 0 - 37 U/L $Remo'22 20 24  'TMmPD$ ALT 0 - 53 U/L $Remo'21 18 29  'AqmfE$ Alk Phosphatase 39 - 117 U/L 66 73 51  Total Bilirubin 0.3 - 1.2 mg/dL 0.4 0.3  0.9  Bilirubin, Direct 0.0 - 0.3 mg/dL <0.2 - -    CBC Latest Ref Rng 06/04/2014 01/29/2014 05/01/2008  WBC 4.0 - 10.5 K/uL 5.3 5.6 5.7  Hemoglobin 13.0 - 17.0 g/dL 13.5 13.8 14.4  Hematocrit 39.0 - 52.0 % 41.0 41.3 40.7  Platelets 150 - 400 K/uL 193 233 255   Lab Results  Component Value Date   MCV 87.4 06/04/2014   MCV 86.6 01/29/2014   MCV 86.1 05/01/2008    BNP No results found for: BNP  ProBNP    Component Value Date/Time   PROBNP <30.0 04/16/2007 0253     Lipid Panel     Component Value Date/Time   CHOL  04/16/2007 0253    188        ATP III CLASSIFICATION:  <200     mg/dL   Desirable  200-239  mg/dL   Borderline High  >=240    mg/dL   High   TRIG 104 04/16/2007 0253   HDL 28* 04/16/2007 0253   CHOLHDL 6.7 04/16/2007 0253   VLDL 21 04/16/2007 0253   LDLCALC * 04/16/2007 0253    139        Total Cholesterol/HDL:CHD Risk Coronary Heart Disease Risk Table                     Men   Women  1/2 Average Risk   3.4   3.3     RADIOLOGY: No results found.    ASSESSMENT AND PLAN: Mr. 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  In November 2015.  He was awakened with substernal chest tightness associated with diaphoresis and shortness of breath.  Cardiac catheterization revealed 30-50% areas of apparent kinking in the circumflex and RCA.  He has been on medication consisting of amlodipine and metoprolol, tartrate for hypertension as well as his mild CAD.  A sleep study suggested increased upper airway resistance without definitive sleep apnea.  Blood pressure today was mildly elevated at 148/88.  I was going to increase his metoprolol, tartrate, but due to issues with erectile dysfunction on metoprolol Iwill change him to [employer].  He will be given a 10 mg pill to initiate  at 5 mg daily.  Depending upon blood  pressure and heart rate response this  will be increased to 10 mg if needed.  He often admits to feeling extremely hot when he wakes up.  I am checking laboratory consisting of thyroid function studies, chemistry profile, magnesium level and lipids on his current therapy.  I'm scheduling him for an echo Doppler study to evaluate systolic and diastolic function as well as valvular architecture.  I will see him in 3 months for reevaluation or sooner if problems arise.   Time spent: 25 minutes  Troy Sine, MD, [facility]  08/02/2014 11:41 PM

## 2014-08-07 ENCOUNTER — Ambulatory Visit (HOSPITAL_COMMUNITY)
Admission: RE | Admit: 2014-08-07 | Discharge: 2014-08-07 | Disposition: A | Discharge status: Home / Self Care | Payer: 59 | Source: Ambulatory Visit | Attending: Cardiovascular Disease | Admitting: Cardiovascular Disease

## 2014-08-07 DIAGNOSIS — R079 Chest pain, unspecified: Secondary | ICD-10-CM | POA: Insufficient documentation

## 2014-08-07 DIAGNOSIS — I77810 Thoracic aortic ectasia: Secondary | ICD-10-CM | POA: Insufficient documentation

## 2014-08-07 DIAGNOSIS — R002 Palpitations: Secondary | ICD-10-CM | POA: Diagnosis not present

## 2014-08-07 DIAGNOSIS — I10 Essential (primary) hypertension: Secondary | ICD-10-CM | POA: Diagnosis not present

## 2014-08-07 DIAGNOSIS — Z8249 Family history of ischemic heart disease and other diseases of the circulatory system: Secondary | ICD-10-CM | POA: Diagnosis not present

## 2014-08-07 DIAGNOSIS — I2510 Atherosclerotic heart disease of native coronary artery without angina pectoris: Secondary | ICD-10-CM | POA: Insufficient documentation

## 2014-08-07 DIAGNOSIS — R200 Anesthesia of skin: Secondary | ICD-10-CM | POA: Insufficient documentation

## 2014-08-07 DIAGNOSIS — I25810 Atherosclerosis of coronary artery bypass graft(s) without angina pectoris: Secondary | ICD-10-CM | POA: Insufficient documentation

## 2014-08-07 DIAGNOSIS — E785 Hyperlipidemia, unspecified: Secondary | ICD-10-CM | POA: Diagnosis not present

## 2014-08-07 DIAGNOSIS — R202 Paresthesia of skin: Secondary | ICD-10-CM | POA: Diagnosis present

## 2014-08-07 LAB — CBC
HEMATOCRIT: 41.2 % (ref 39.0–52.0)
Hemoglobin: 13.7 g/dL (ref 13.0–17.0)
MCH: 28.5 pg (ref 26.0–34.0)
MCHC: 33.3 g/dL (ref 30.0–36.0)
MCV: 85.8 fL (ref 78.0–100.0)
MPV: 9.8 fL (ref 8.6–12.4)
PLATELETS: 286 10*3/uL (ref 150–400)
RBC: 4.8 MIL/uL (ref 4.22–5.81)
RDW: 14.6 % (ref 11.5–15.5)
WBC: 6.1 10*3/uL (ref 4.0–10.5)

## 2014-08-08 LAB — COMPREHENSIVE METABOLIC PANEL
ALBUMIN: 4.5 g/dL (ref 3.5–5.2)
ALT: 17 U/L (ref 0–53)
AST: 17 U/L (ref 0–37)
Alkaline Phosphatase: 81 U/L (ref 39–117)
BUN: 11 mg/dL (ref 6–23)
CO2: 27 mEq/L (ref 19–32)
CREATININE: 1.07 mg/dL (ref 0.50–1.35)
Calcium: 9.9 mg/dL (ref 8.4–10.5)
Chloride: 103 mEq/L (ref 96–112)
Glucose, Bld: 91 mg/dL (ref 70–99)
Potassium: 3.9 mEq/L (ref 3.5–5.3)
Sodium: 141 mEq/L (ref 135–145)
Total Bilirubin: 0.8 mg/dL (ref 0.2–1.2)
Total Protein: 7.8 g/dL (ref 6.0–8.3)

## 2014-08-08 LAB — LIPID PANEL
Cholesterol: 183 mg/dL (ref 0–200)
HDL: 42 mg/dL (ref >=40)
LDL CALC: 120 mg/dL — AB (ref 0–99)
TRIGLYCERIDES: 103 mg/dL (ref <150)
Total CHOL/HDL Ratio: 4.4 Ratio
VLDL: 21 mg/dL (ref 0–40)

## 2014-08-08 LAB — MAGNESIUM: Magnesium: 2.2 mg/dL (ref 1.5–2.5)

## 2014-08-08 LAB — TSH: TSH: 0.841 u[IU]/mL (ref 0.350–4.500)

## 2014-08-14 ENCOUNTER — Telehealth: Payer: Self-pay | Admitting: *Deleted

## 2014-08-14 ENCOUNTER — Other Ambulatory Visit: Payer: Self-pay | Admitting: *Deleted

## 2014-08-14 MED ORDER — ATORVASTATIN CALCIUM 20 MG PO TABS: 20.0000 mg | ORAL_TABLET | Freq: Every day | ORAL | Status: DC

## 2014-08-14 NOTE — Telephone Encounter (Signed)
-----   Message from Lennette Biharihomas A Kelly, MD sent at 08/14/2014  8:34 AM EDT ----- Labs good x LDL 120; add atorvastatin 20 mg

## 2014-08-14 NOTE — Telephone Encounter (Signed)
Called patient and gave him lab results and recommendations. Patient voiced understanding.

## 2014-08-14 NOTE — Telephone Encounter (Signed)
Returning your call. °

## 2014-08-14 NOTE — Progress Notes (Signed)
Called and gave patient lab results and recommendations.

## 2014-09-19 ENCOUNTER — Encounter: Payer: Self-pay | Admitting: Internal Medicine

## 2014-09-23 ENCOUNTER — Telehealth: Payer: Self-pay | Admitting: Cardiovascular Disease

## 2014-09-23 MED ORDER — NEBIVOLOL HCL 10 MG PO TABS: 5.0000 mg | ORAL_TABLET | Freq: Every day | ORAL | Status: DC

## 2014-09-23 NOTE — Telephone Encounter (Signed)
°  1. Which medications need to be refilled? Bystolic 10 mg .. Needs a new prescription sent   2. [facility]   3. Do they need a 30 day or 90 day supply? 30  4. Would they like a call back once the medication has been sent to the pharmacy? yes

## 2014-09-23 NOTE — Telephone Encounter (Signed)
Refill submitted to patient's preferred pharmacy. Informed patient. Pt voiced understanding, no other stated concerns at this time.  

## 2014-10-06 ENCOUNTER — Encounter: Payer: Self-pay | Admitting: Cardiovascular Disease

## 2014-10-06 ENCOUNTER — Ambulatory Visit (INDEPENDENT_AMBULATORY_CARE_PROVIDER_SITE_OTHER): Payer: 59 | Admitting: Cardiovascular Disease

## 2014-10-06 VITALS — BP 144/88 | HR 63 | Ht 73.0 in | Wt 217.6 lb

## 2014-10-06 DIAGNOSIS — I2510 Atherosclerotic heart disease of native coronary artery without angina pectoris: Secondary | ICD-10-CM

## 2014-10-06 DIAGNOSIS — E785 Hyperlipidemia, unspecified: Secondary | ICD-10-CM

## 2014-10-06 DIAGNOSIS — I10 Essential (primary) hypertension: Secondary | ICD-10-CM

## 2014-10-06 DIAGNOSIS — I2583 Coronary atherosclerosis due to lipid rich plaque: Secondary | ICD-10-CM

## 2014-10-06 NOTE — Progress Notes (Signed)
Patient ID: [name], male   DOB: 1955/06/22, 60 y.o.   MRN: [id number]     HPI: [name] is a 59 y.o. male who presents to the office today for a two-month follow-up cardiology evaluation.  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  I had seen him in [place] emergency room when he presented on 01/29/2014 after being awakened from a deep sleep with substernal chest pressure associated with diaphoresis and mild shortness of breath.  He has a history of accelerated hypertension and previously it had blood pressures in excess of 200 bpm.  His ECG on presentation did not reveal acute ST segment changes.  He continue to experience residual episodes of chest pain and cardiology consultation was recommended.  His cardiac risk.  Risk factor notable for long-standing history of hypertension as well as family history for heart disease.  I recommended definitive cardiac catheterization.  Catheterization was done by Dr. Tamala Julian in both the right and circumflex coronary arteries contained a region of kinking that in one view appeared to obstructive vessel up to 50%.  Normal LV function.  Ejection fraction was 55-60%.  He was seen in follow-up of his hospitalization by Cecilie Kicks on 03/05/2014.  He did not experience recurrent chest pain.  He had been on a PPI.  He had had some bradycardia in the hospital and a subsequent event monitor did not reveal significant bradycardia arrhythmia and he was restarted back on metoprolol at 25 mg twice a day.  Due to concerns for sleep apnea particularly with his chest pain awakening him from sleep he underwent a sleep study on November 12 23 2015.  This suggested increased upper airway resistance syndrome but definitive sleep apnea was not demonstrated.  His AHI was 0.4; however, his RDI was 8.2.  He had an increased arousals.  There was no evidence for nocturnal myoclonus.  When I last saw him several months ago his blood pressure was mildly  increased at 148/88. Since he did have some issues with erectile dysfunction on metoprolol.  I changed him to Bystolic and recommended initiation of 5 mg daily. An echo Doppler study was done on 08/07/2014 which showed an ejection fraction of 55-60%.  There was grade 2 diastolic dysfunction.  There was trivial aortic insufficiency and mild dilatation of his aortic root drink 44 mm.  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  He   Laboratory and his lipid studies revealed a cholesterol 183 LDL cholesterol 120, HDL 42 and triglycerides 103.  As result, he was started on atorvastatin 20 mg and has been taking this without side effects.  He's not had follow-up blood work.  He denies chest pain, PND, orthopnea or any myalgias.  He presents for evaluation.    Past Medical History  Diagnosis Date  . Hypertension   . Reflux   . Spinal stenosis   . S/P cardiac cath 01/29/14     but with area of kinking obstructing the LCX to 50%. other wise patent coronary arteries.    Past Surgical History  Procedure Laterality Date  . Cholecystectomy    . Cardiac catheterization  01/29/14    patent coronary arteries  but with area of kinking obstructing the LCX to 50%.   . Left heart catheterization with coronary angiogram N/A 01/29/2014    Procedure: LEFT HEART CATHETERIZATION WITH CORONARY ANGIOGRAM;  Surgeon: Sinclair Grooms, MD;  Location: [facility] CATH  LAB;  Service: Cardiovascular;  Laterality: N/A;    Allergies  Allergen Reactions  . Enalapril Cough  . Neurontin [Gabapentin] Palpitations    Current Outpatient Prescriptions  Medication Sig Dispense Refill  . acetaminophen (TYLENOL) 325 MG tablet Take 2 tablets (650 mg total) by mouth every 4 (four) hours as needed for headache or mild pain.    Marland Kitchen amLODipine (NORVASC) 5 MG tablet Take 1 tablet (5 mg total) by mouth 2 (two) times daily. 60 tablet 6  . aspirin 81 MG chewable tablet Chew 1 tablet (81 mg total) by mouth  daily.    Marland Kitchen atorvastatin (LIPITOR) 20 MG tablet Take 1 tablet (20 mg total) by mouth daily. 30 tablet 6  . nebivolol (BYSTOLIC) 10 MG tablet Take 0.5 tablets (5 mg total) by mouth daily. 30 tablet 5   No current facility-administered medications for this visit.    History   Social History  . Marital Status: Married    Spouse Name: N/A  . Number of Children: N/A  . Years of Education: N/A   Occupational History  . Not on file.   Social History Main Topics  . Smoking status: Never Smoker   . Smokeless tobacco: Never Used  . Alcohol Use: No  . Drug Use: No  . Sexual Activity: Yes   Other Topics Concern  . Not on file   Social History Narrative   Social history is notable that he is married, has 5 children.  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  He now lives in [place].  There is no tobacco use.  Family History  Problem Relation Age of Onset  . Heart failure Father   . Hypertension Father   . Lung cancer Sister   . Pancreatic cancer Sister   . Cancer Sister   . Hypertension Mother    Family history is also notable that his mother is still living at age 49.  Father died at age 3 and had undergone CABG surgery at 92.  He has 5 siblings and several sisters who have had SVT.  One sister was recently diagnosed with adrenal gland issues.  ROS General: Negative; No fevers, chills, or night sweats HEENT: Negative; No changes in vision or hearing, sinus congestion, difficulty swallowing Pulmonary: Negative; No cough, wheezing, shortness of breath, hemoptysis Cardiovascular: See HPI: No chest pain, presyncope, syncope, palpatations GI: Negative; No nausea, vomiting, diarrhea, or abdominal pain GU: Negative; No dysuria, hematuria, or difficulty voiding Musculoskeletal: Negative; no myalgias, joint pain, or weakness Hematologic: Negative; no easy bruising, bleeding Endocrine: Negative; no heat/cold intolerance; no diabetes, Neuro: Negative; no  changes in balance, headaches Skin: Negative; No rashes or skin lesions Psychiatric: Negative; No behavioral problems, depression Sleep: Negative; No snoring,  daytime sleepiness, hypersomnolence, bruxism, restless legs, hypnogognic hallucinations. Other comprehensive 14 point system review is negative   Physical Exam BP 144/88 mmHg  Pulse 63  Ht $R'6\' 1"'HH$  (1.854 m)  Wt 217 lb 9.6 oz (98.703 kg)  BMI 28.72 kg/m2 Wt Readings from Last 3 Encounters:  10/06/14 217 lb 9.6 oz (98.703 kg)  07/31/14 214 lb 11.2 oz (97.387 kg)  06/04/14 210 lb (95.255 kg)   General: Alert, oriented, no distress.  Skin: normal turgor, no rashes, warm and dry HEENT: Normocephalic, atraumatic. Pupils equal round and reactive to light; sclera anicteric; extraocular muscles intact, No lid lag; Nose without nasal septal hypertrophy; Mouth/Parynx benign; Mallinpatti scale 2/3 Neck: No JVD, no carotid bruits; normal carotid upstroke Lungs: clear  to ausculatation and percussion bilaterally; no wheezing or rales, normal inspiratory and expiratory effort Chest wall: without tenderness to palpitation Heart: PMI not displaced, RRR, s1 s2 normal, 1/6 systolic murmur, No diastolic murmur, no rubs, gallops, thrills, or heaves Abdomen: soft, nontender; no hepatosplenomehaly, BS+; abdominal aorta nontender and not dilated by palpation. Back: no CVA tenderness Pulses: 2+  Musculoskeletal: full range of motion, normal strength, no joint deformities Extremities: Pulses 2+, no clubbing cyanosis or edema, Homan's sign negative  Neurologic: grossly nonfocal; Cranial nerves grossly wnl Psychologic: Normal mood and affect  ECG (independently read by me):  Normal sinus rhythm at 63 bpm.  Borderline first-degree AV block with a PR interval at 206 ms.   ECG (independently read by me): Normal sinus rhythm at 68 bpm.  Nonspecific ST changes in lead 3.  QTc interval 414 ms.  LABS:  BMP Latest Ref Rng 08/07/2014 06/04/2014 01/29/2014    Glucose 70 - 99 mg/dL 91 101(H) 106(H)  BUN 6 - 23 mg/dL $Remove'11 13 12  'PvOOYWJ$ Creatinine 0.50 - 1.35 mg/dL 1.07 1.31 1.05  Sodium 135 - 145 mEq/L 141 141 140  Potassium 3.5 - 5.3 mEq/L 3.9 3.9 3.7  Chloride 96 - 112 mEq/L 103 105 99  CO2 19 - 32 mEq/L $Remove'27 28 29  'fQOzhlH$ Calcium 8.4 - 10.5 mg/dL 9.9 9.2 9.3     Hepatic Function Latest Ref Rng 08/07/2014 01/29/2014 11/02/2013  Total Protein 6.0 - 8.3 g/dL 7.8 7.3 7.4  Albumin 3.5 - 5.2 g/dL 4.5 3.7 3.9  AST 0 - 37 U/L $Remo'17 22 20  'RKjbI$ ALT 0 - 53 U/L $Remo'17 21 18  'aWFvI$ Alk Phosphatase 39 - 117 U/L 81 66 73  Total Bilirubin 0.2 - 1.2 mg/dL 0.8 0.4 0.3  Bilirubin, Direct 0.0 - 0.3 mg/dL - <0.2 -    CBC Latest Ref Rng 08/07/2014 06/04/2014 01/29/2014  WBC 4.0 - 10.5 K/uL 6.1 5.3 5.6  Hemoglobin 13.0 - 17.0 g/dL 13.7 13.5 13.8  Hematocrit 39.0 - 52.0 % 41.2 41.0 41.3  Platelets 150 - 400 K/uL 286 193 233   Lab Results  Component Value Date   MCV 85.8 08/07/2014   MCV 87.4 06/04/2014   MCV 86.6 01/29/2014    BNP No results found for: BNP  ProBNP    Component Value Date/Time   PROBNP <30.0 04/16/2007 0253     Lipid Panel     Component Value Date/Time   CHOL 183 08/07/2014 0958   TRIG 103 08/07/2014 0958   HDL 42 08/07/2014 0958   CHOLHDL 4.4 08/07/2014 0958   VLDL 21 08/07/2014 0958   LDLCALC 120* 08/07/2014 0958     RADIOLOGY: No results found.    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  In November 2015 he was awakened with substernal chest tightness associated with diaphoresis and shortness of breath.  Cardiac catheterization revealed 30-50% areas of apparent kinking in the circumflex and RCA.   He has been on amlodipine as well as beta blocker therapy since his catheterization.  When I last saw him, due to issues with erectile dysfunction.  I recommended  discontinuance of the metoprolol and started him on Bystolic, initially at 5 mg daily with plans to up titrate if necessary to 10 mg.  His blood pressure today is improved at 128/80. He was recently started  on atorvastatin 20 mg for hyperlipidemia and is tolerating this well with target LDL less than 70.  I reviewed his echo Doppler data which shows normal systolic function, probable grade 2 diastolic dysfunction, and mild aortic root dilatation.  He will undergo follow-up laboratory to assess the effects of his medical regimen several months.  I will see him in 6 months for cardiology reevaluation or sooner if problems arise.  Time spent: 25 minutes  Troy Sine, MD, [facility]  10/06/2014 7:06 PM

## 2014-10-06 NOTE — Patient Instructions (Signed)
Your physician wants you to follow-up in: 6 months or sooner if needed. You will receive a reminder letter in the mail two months in advance. If you don't receive a letter, please call our office to schedule the follow-up appointment. 

## 2014-12-11 ENCOUNTER — Other Ambulatory Visit: Payer: Self-pay | Admitting: Cardiology

## 2014-12-20 ENCOUNTER — Emergency Department (HOSPITAL_COMMUNITY)
Admission: EM | Admit: 2014-12-20 | Discharge: 2014-12-20 | Disposition: A | Discharge status: Home / Self Care | Payer: Self-pay | Attending: Emergency Medicine | Admitting: Emergency Medicine

## 2014-12-20 ENCOUNTER — Encounter (HOSPITAL_COMMUNITY): Payer: Self-pay | Admitting: *Deleted

## 2014-12-20 ENCOUNTER — Emergency Department (HOSPITAL_COMMUNITY): Payer: Self-pay

## 2014-12-20 DIAGNOSIS — R001 Bradycardia, unspecified: Secondary | ICD-10-CM | POA: Insufficient documentation

## 2014-12-20 DIAGNOSIS — I10 Essential (primary) hypertension: Secondary | ICD-10-CM | POA: Insufficient documentation

## 2014-12-20 DIAGNOSIS — Z8739 Personal history of other diseases of the musculoskeletal system and connective tissue: Secondary | ICD-10-CM | POA: Insufficient documentation

## 2014-12-20 DIAGNOSIS — Z9889 Other specified postprocedural states: Secondary | ICD-10-CM | POA: Insufficient documentation

## 2014-12-20 DIAGNOSIS — R079 Chest pain, unspecified: Secondary | ICD-10-CM | POA: Insufficient documentation

## 2014-12-20 DIAGNOSIS — Z8719 Personal history of other diseases of the digestive system: Secondary | ICD-10-CM | POA: Insufficient documentation

## 2014-12-20 DIAGNOSIS — Z79899 Other long term (current) drug therapy: Secondary | ICD-10-CM | POA: Insufficient documentation

## 2014-12-20 DIAGNOSIS — R55 Syncope and collapse: Secondary | ICD-10-CM | POA: Insufficient documentation

## 2014-12-20 DIAGNOSIS — Z7982 Long term (current) use of aspirin: Secondary | ICD-10-CM | POA: Insufficient documentation

## 2014-12-20 DIAGNOSIS — R61 Generalized hyperhidrosis: Secondary | ICD-10-CM | POA: Insufficient documentation

## 2014-12-20 LAB — CBC
HCT: 41.5 % (ref 39.0–52.0)
Hemoglobin: 13.7 g/dL (ref 13.0–17.0)
MCH: 28.8 pg (ref 26.0–34.0)
MCHC: 33 g/dL (ref 30.0–36.0)
MCV: 87.4 fL (ref 78.0–100.0)
Platelets: 248 10*3/uL (ref 150–400)
RBC: 4.75 MIL/uL (ref 4.22–5.81)
RDW: 13.8 % (ref 11.5–15.5)
WBC: 6 10*3/uL (ref 4.0–10.5)

## 2014-12-20 LAB — I-STAT TROPONIN, ED: Troponin i, poc: 0 ng/mL (ref 0.00–0.08)

## 2014-12-20 LAB — BASIC METABOLIC PANEL
Anion gap: 8 (ref 5–15)
BUN: 11 mg/dL (ref 6–20)
CO2: 25 mmol/L (ref 22–32)
Calcium: 9.1 mg/dL (ref 8.9–10.3)
Chloride: 107 mmol/L (ref 101–111)
Creatinine, Ser: 1.11 mg/dL (ref 0.61–1.24)
GFR calc Af Amer: >60 mL/min (ref >60)
GFR calc non Af Amer: >60 mL/min (ref >60)
Glucose, Bld: 138 mg/dL — ABNORMAL HIGH (ref 65–99)
Potassium: 3.1 mmol/L — ABNORMAL LOW (ref 3.5–5.1)
Sodium: 140 mmol/L (ref 135–145)

## 2014-12-20 LAB — MAGNESIUM: Magnesium: 2.2 mg/dL (ref 1.7–2.4)

## 2014-12-20 MED ORDER — NEBIVOLOL HCL 2.5 MG PO TABS: 2.5000 mg | ORAL_TABLET | Freq: Every day | ORAL | Status: DC

## 2014-12-20 MED ORDER — POTASSIUM CHLORIDE CRYS ER 20 MEQ PO TBCR
40.0000 meq | EXTENDED_RELEASE_TABLET | Freq: Once | ORAL | Status: AC
  Administered 2014-12-20: 40 meq via ORAL
  Filled 2014-12-20: qty 2

## 2014-12-20 MED ORDER — ASPIRIN 81 MG PO CHEW
324.0000 mg | CHEWABLE_TABLET | Freq: Once | ORAL | Status: AC
  Administered 2014-12-20: 324 mg via ORAL
  Filled 2014-12-20: qty 4

## 2014-12-20 MED ORDER — ATROPINE SULFATE 0.1 MG/ML IJ SOLN
0.5000 mg | Freq: Once | INTRAMUSCULAR | Status: DC
  Filled 2014-12-20: qty 10

## 2014-12-20 NOTE — ED Provider Notes (Signed)
CSN: [id number]     Arrival date & time 12/20/14  0302 History   First MD Initiated Contact with Patient 12/20/14 0355     Chief Complaint  Patient presents with  . Palpitations     (Consider location/radiation/quality/duration/timing/severity/associated sxs/prior Treatment) HPI   58yM with CP and palpitations. Awakened from sleep shortly before arrival with substernal chest pressure associated with diaphoresis and mild shortness of breath. Pt improved by the time he arrived to ED but then felt the same symptoms again shortly after being triaged. At this time his HR dropped into the mid 20s. I was called to bedside because of this and HR was in 40s by the time I arrived. Sinus on monitor. Pt diaphoretic, pale and generally ill appearing.  He did not lose consciousness. HR back to 60s within a couple minutes and feeling better but then developed fluttering/twitching sensation in chest again. HR remained sinus with rate in 60s at this time.  Patient has had similar episodes previously and had pretty extensive work-up.  Catheterization was done by Dr. Katrinka Blazing 01/2014.  Right and circumflex coronary arteries contained a region of kinking that in one view appeared to obstructive vessel up to 50%. Normal LV function.  He had had some bradycardia in the hospital and a subsequent event monitor did not reveal significant bradycardia arrhythmia and he was restarted back on metoprolol at 25 mg twice a day.Due to concerns for sleep apnea particularly with his chest pain awakening him from sleep he underwent a sleep study on November 12 23 2015. This suggested increased upper airway resistance syndrome but definitive sleep apnea was not demonstrated.Seen by Dr Tresa Endo in July. Metoprolol changed to bystolic because of some issues with erectile dysfunction.   Past Medical History  Diagnosis Date  . Hypertension   . Reflux   . Spinal stenosis   . S/P cardiac cath 01/29/14     but with area of kinking  obstructing the LCX to 50%. other wise patent coronary arteries.   Past Surgical History  Procedure Laterality Date  . Cholecystectomy    . Cardiac catheterization  01/29/14    patent coronary arteries  but with area of kinking obstructing the LCX to 50%.   . Left heart catheterization with coronary angiogram N/A 01/29/2014    Procedure: LEFT HEART CATHETERIZATION WITH CORONARY ANGIOGRAM;  Surgeon: Lesleigh Noe, MD;  Location: [facility] CATH LAB;  Service: Cardiovascular;  Laterality: N/A;   Family History  Problem Relation Age of Onset  . Heart failure Father   . Hypertension Father   . Lung cancer Sister   . Pancreatic cancer Sister   . Cancer Sister   . Hypertension Mother    Social History  Substance Use Topics  . Smoking status: Never Smoker   . Smokeless tobacco: Never Used  . Alcohol Use: No    Review of Systems  All systems reviewed and negative, other than as noted in HPI.   Allergies  Enalapril and Neurontin  Home Medications   Prior to Admission medications   Medication Sig Start Date End Date Taking? Authorizing Provider  acetaminophen (TYLENOL) 325 MG tablet Take 2 tablets (650 mg total) by mouth every 4 (four) hours as needed for headache or mild pain. 01/30/14   Leone Brand, NP  amLODipine (NORVASC) 5 MG tablet TAKE 1 TABLET (5 MG TOTAL) BY MOUTH 2 (TWO) TIMES DAILY. 12/11/14   Leone Brand, NP  aspirin 81 MG chewable tablet Chew 1 tablet (81  mg total) by mouth daily. 01/30/14   Leone Brand, NP  atorvastatin (LIPITOR) 20 MG tablet Take 1 tablet (20 mg total) by mouth daily. 08/14/14   Lennette Bihari, MD  nebivolol (BYSTOLIC) 10 MG tablet Take 0.5 tablets (5 mg total) by mouth daily. 09/23/14   Lennette Bihari, MD   BP 102/58 mmHg  Pulse 53  Resp 11  Ht  (1.854 m)  Wt 214 lb (97.07 kg)  BMI 28.24 kg/m2  SpO2 100% Physical Exam  Constitutional: He appears well-developed and well-nourished.  Appears pale and generally ill appearing  HENT:  Head:  Normocephalic and atraumatic.  Eyes: Conjunctivae are normal. Right eye exhibits no discharge. Left eye exhibits no discharge.  Neck: Neck supple.  Cardiovascular: Regular rhythm and normal heart sounds.  Exam reveals no gallop and no friction rub.   No murmur heard. Bradycardia. Regular.   Pulmonary/Chest: Effort normal and breath sounds normal. No respiratory distress.  Abdominal: Soft. He exhibits no distension. There is no tenderness.  Musculoskeletal: He exhibits no edema or tenderness.  Neurological: He is alert.  Skin: Skin is warm. He is diaphoretic.  Psychiatric: He has a normal mood and affect. His behavior is normal. Thought content normal.  Nursing note and vitals reviewed.   ED Course  Procedures (including critical care time) Labs Review Labs Reviewed  BASIC METABOLIC PANEL - Abnormal; Notable for the following:    Potassium 3.1 (*)    Glucose, Bld 138 (*)    All other components within normal limits  CBC  MAGNESIUM  I-STAT TROPOININ, ED    Imaging Review Dg Chest 2 View  12/20/2014   CLINICAL DATA:  Pt c/o sob, chest tightness, and palpitations starting this evening. Hx of hypertension.  EXAM: CHEST  2 VIEW  COMPARISON:  06/04/2014  FINDINGS: The heart size and mediastinal contours are within normal limits. Both lungs are clear. The visualized skeletal structures are unremarkable.  IMPRESSION: No active cardiopulmonary disease.   Electronically Signed   By: Burman Nieves M.D.   On: 12/20/2014 03:35   I have personally reviewed and evaluated these images and lab results as part of my medical decision-making.   EKG Interpretation   Date/Time:  Saturday December 20 2014 03:48:55 EDT Ventricular Rate:  42 PR Interval:  176 QRS Duration: 96 QT Interval:  421 QTC Calculation: 352 R Axis:   78 Text Interpretation:  Sinus bradycardia Borderline repolarization  abnormality ED PHYSICIAN INTERPRETATION AVAILABLE IN [facility]  Confirmed by TEST, Record  ([id number]) on 12/21/2014 10:03:55 AM      MDM   Final diagnoses:  Chest pain, unspecified chest pain type  Near syncope  Bradycardia    58yM with CP, diaphoresis and near syncope. Documented HR down to 20s in ED. Sinus. Not clear is symptoms he has been having from bradycardia or if symptoms today subsequently illicited a vagal response. Discussed with Dr Donnie Aho. Pt not currently w/o symptoms. Has been in ED a couple hours since this episode w/o reoccurence. Testing has been fairly unrevealing. Recommended decreasing bystolic dose by half and having him follow-up with Dr Tresa Endo in the office this upcoming week.     Raeford Razor, MD 12/27/14 [phone]

## 2014-12-20 NOTE — ED Notes (Signed)
Spoke with wife and she states that patient has been sweaty at night, with  increased blood pressure. This had happened before after he has taken nitro. Please call at [phone]

## 2014-12-20 NOTE — ED Notes (Signed)
Pt's daughter came out of the room and advised that pt felt like he was going to pass out; upon entering the room patient was slow to respond and had approx 15 sec where he did not respond; pt HR noted to drop in the 20's; pt began diaphoretic and heart rate fluctuated for several minutes after; 0354  Pt HR up to 67 and pt more awake and alert

## 2014-12-20 NOTE — Discharge Instructions (Signed)
°Chest Pain (Nonspecific) °It is often hard to give a specific diagnosis for the cause of chest pain. There is always a chance that your pain could be related to something serious, such as a heart attack or a blood clot in the lungs. You need to follow up with your health care provider for further evaluation. °CAUSES  °· Heartburn. °· Pneumonia or bronchitis. °· Anxiety or stress. °· Inflammation around your heart (pericarditis) or lung (pleuritis or pleurisy). °· A blood clot in the lung. °· A collapsed lung (pneumothorax). It can develop suddenly on its own (spontaneous pneumothorax) or from trauma to the chest. °· Shingles infection (herpes zoster virus). °The chest wall is composed of bones, muscles, and cartilage. Any of these can be the source of the pain. °· The bones can be bruised by injury. °· The muscles or cartilage can be strained by coughing or overwork. °· The cartilage can be affected by inflammation and become sore (costochondritis). °DIAGNOSIS  °Lab tests or other studies may be needed to find the cause of your pain. Your health care provider may have you take a test called an ambulatory electrocardiogram (ECG). An ECG records your heartbeat patterns over a 24-hour period. You may also have other tests, such as: °· Transthoracic echocardiogram (TTE). During echocardiography, sound waves are used to evaluate how blood flows through your heart. °· Transesophageal echocardiogram (TEE). °· Cardiac monitoring. This allows your health care provider to monitor your heart rate and rhythm in real time. °· Holter monitor. This is a portable device that records your heartbeat and can help diagnose heart arrhythmias. It allows your health care provider to track your heart activity for several days, if needed. °· Stress tests by exercise or by giving medicine that makes the heart beat faster. °TREATMENT  °· Treatment depends on what may be causing your chest pain. Treatment may include: °¨ Acid blockers for  heartburn. °¨ Anti-inflammatory medicine. °¨ Pain medicine for inflammatory conditions. °¨ Antibiotics if an infection is present. °· You may be advised to change lifestyle habits. This includes stopping smoking and avoiding alcohol, caffeine, and chocolate. °· You may be advised to keep your head raised (elevated) when sleeping. This reduces the chance of acid going backward from your stomach into your esophagus. °Most of the time, nonspecific chest pain will improve within 2-3 days with rest and mild pain medicine.  °HOME CARE INSTRUCTIONS  °· If antibiotics were prescribed, take them as directed. Finish them even if you start to feel better. °· For the next few days, avoid physical activities that bring on chest pain. Continue physical activities as directed. °· Do not use any tobacco products, including cigarettes, chewing tobacco, or electronic cigarettes. °· Avoid drinking alcohol. °· Only take medicine as directed by your health care provider. °· Follow your health care provider's suggestions for further testing if your chest pain does not go away. °· Keep any follow-up appointments you made. If you do not go to an appointment, you could develop lasting (chronic) problems with pain. If there is any problem keeping an appointment, call to reschedule. °SEEK MEDICAL CARE IF:  °· Your chest pain does not go away, even after treatment. °· You have a rash with blisters on your chest. °· You have a fever. °SEEK IMMEDIATE MEDICAL CARE IF:  °· You have increased chest pain or pain that spreads to your arm, neck, jaw, back, or abdomen. °· You have shortness of breath. °· You have an increasing cough, or you cough   up blood. °· You have severe back or abdominal pain. °· You feel nauseous or vomit. °· You have severe weakness. °· You faint. °· You have chills. °This is an emergency. Do not wait to see if the pain will go away. Get medical help at once. Call your local emergency services (911 in [place] Do not drive  yourself to the hospital. °MAKE SURE YOU:  °· Understand these instructions. °· Will watch your condition. °· Will get help right away if you are not doing well or get worse. °Document Released: 12/22/2004 Document Revised: 03/19/2013 Document Reviewed: 10/18/2007 °ExitCare® Patient Information ©2015 [employer]. This information is not intended to replace advice given to you by your health care provider. Make sure you discuss any questions you have with your health care provider. °Bradycardia °Bradycardia is a term for a heart rate (pulse) that, in adults, is slower than 60 beats per minute. A normal rate is 60 to 100 beats per minute. A heart rate below 60 beats per minute may be normal for some adults with healthy hearts. If the rate is too slow, the heart may have trouble pumping the volume of blood the body needs. If the heart rate gets too low, blood flow to the brain may be decreased and may make you feel lightheaded, dizzy, or faint. °The heart has a natural pacemaker in the top of the heart called the SA node (sinoatrial or sinus node). This pacemaker sends out regular electrical signals to the muscle of the heart, telling the heart muscle when to beat (contract). The electrical signal travels from the upper parts of the heart (atria) through the AV node (atrioventricular node), to the lower chambers of the heart (ventricles). The ventricles squeeze, pumping the blood from your heart to your lungs and to the rest of your body. °CAUSES  °· Problem with the heart's electrical system. °· Problem with the heart's natural pacemaker. °· Heart disease, damage, or infection. °· Medications. °· Problems with minerals and salts (electrolytes). °SYMPTOMS  °· Fainting (syncope). °· Fatigue and weakness. °· Shortness of breath (dyspnea). °· Chest pain (angina). °· Drowsiness. °· Confusion. °DIAGNOSIS  °· An electrocardiogram (ECG) can help your caregiver determine the type of slow heart rate you have. °· If the cause  is not seen on an ECG, you may need to wear a heart monitor that records your heart rhythm for several hours or days. °· Blood tests. °TREATMENT  °· Electrolyte supplements. °· Medications. °· Withholding medication which is causing a slow heart rate. °· Pacemaker placement. °SEEK IMMEDIATE MEDICAL CARE IF:  °· You feel lightheaded or faint. °· You develop an irregular heart rate. °· You feel chest pain or have trouble breathing. °MAKE SURE YOU:  °· Understand these instructions. °· Will watch your condition. °· Will get help right away if you are not doing well or get worse. °Document Released: 12/04/2001 Document Revised: 06/06/2011 Document Reviewed: 06/19/2013 °ExitCare® Patient Information ©2015 [employer]. This information is not intended to replace advice given to you by your health care provider. Make sure you discuss any questions you have with your health care provider. ° °

## 2014-12-20 NOTE — ED Notes (Signed)
Pt states that he woke up around 2:30 drenched in sweat; pt states that he felt his heart fluttering; pt c/o of chest tightness at that time; pt states that he took his BP at home and it was 180's / 80's; pt c/o shortness of breath at times and feeling shakey; pt states that he got chills and was shaking all over

## 2014-12-23 ENCOUNTER — Encounter: Payer: Self-pay | Admitting: Physician Assistant

## 2014-12-23 ENCOUNTER — Ambulatory Visit (INDEPENDENT_AMBULATORY_CARE_PROVIDER_SITE_OTHER): Payer: Self-pay | Admitting: Physician Assistant

## 2014-12-23 VITALS — BP 128/62 | HR 76 | Ht 73.0 in | Wt 213.9 lb

## 2014-12-23 DIAGNOSIS — R55 Syncope and collapse: Secondary | ICD-10-CM

## 2014-12-23 DIAGNOSIS — I10 Essential (primary) hypertension: Secondary | ICD-10-CM

## 2014-12-23 DIAGNOSIS — Z79899 Other long term (current) drug therapy: Secondary | ICD-10-CM

## 2014-12-23 MED ORDER — SPIRONOLACTONE 25 MG PO TABS: 25.0000 mg | ORAL_TABLET | Freq: Every day | ORAL | Status: DC

## 2014-12-23 MED ORDER — RANITIDINE HCL 150 MG PO TABS: 150.0000 mg | ORAL_TABLET | Freq: Two times a day (BID) | ORAL | Status: DC

## 2014-12-23 NOTE — Progress Notes (Signed)
Cardiology Office Note   Date:  12/23/2014   ID:  [name], DOB Aug 31, 1955, MRN [id number]  PCP:  Ardyth Man, MD  Cardiologist:  Dr. Venora Maples, PA-C   Chief Complaint  Patient presents with  . Hospitalization Follow-up    chest tightness, pt stated he woke up in sweat and tingling in skin.    History of Present Illness: [name] is a 59 y.o. male with a history of cath 2015 w/ 50% CFX & RCA, nl LV; S brady, watch BB, HTN, ED on metoprolol, and recent ER visit for chest pain and palpitations, sinus bradycardia on the monitor  [name] presents for post hospital follow-up.  He has had multiple episodes of presyncope, and one episode of syncope. Until this last time, they have all started exactly the same.  He will awaken with the need to urinate. At that time, he will feel perhaps a little hot. While he was urine needing, he would become tingly, start sweating, and become lightheaded. One time the symptoms were so severe that he actually did briefly lose consciousness. He would also have chest tightness.   He follows his blood pressure closely. His blood pressures never well controlled. Generally the lower numbers are in the 140s systolic and he will be up in the 150s to 170s. His diastolic is in the 80s and sometimes higher than 85. His heart rate is generally in the 60s.  During these episodes, he has not documented any heart rates that were less than 60. His systolic blood pressure once when he first woke up and was hot with some diaphoresis, was 124/72, with a heart rate of 64. Generally however his blood pressure is higher. He and his wife associated these episodes of being lightheaded with hypertension, sake his blood pressure is generally higher than average during the episodes or right after the episodes, with a heart rate in the 60s.  An episode the other night was worse and lasted longer. While he did not lose consciousness, he felt worse and was both  hot and diaphoretic when he first woke up. In the emergency room, he had an episode of loss of consciousness that was associated with a heart rate in the 20s. By the time the ER physician arrived, his heart rate was in the 40s. An ECG shows sinus bradycardia with a heart rate of 42. The nursing note documents a heart rate in the 20s, but no mention is made of the rhythm.  The only other issue he is having at this time is significant reflux symptoms which she has multiple times during the day. He does not take any medications to prevent them.  He and his wife are concerned about the episodes and are also concerned about his blood pressure being high. Of note, 2 of his sisters have neurocardiogenic syncope and have had tilt table testing. They are on spironolactone and metoprolol.  Past Medical History  Diagnosis Date  . Hypertension   . Reflux   . Spinal stenosis   . S/P cardiac cath 01/29/14     but with area of kinking obstructing the LCX to 50%. other wise patent coronary arteries.  . Sinus bradycardia 2015    Event monitor showed no critical bradycardia, tolerating low-dose beta blocker  . 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 His AHI was 0.4; however, his RDI was 8.2. He had  an increased arousals. There was no evidence for nocturnal myoclonus.    Past Surgical History  Procedure Laterality Date  . Cholecystectomy    . Left heart catheterization with coronary angiogram N/A 01/29/2014    Procedure: LEFT HEART CATHETERIZATION WITH CORONARY ANGIOGRAM;  Surgeon: Lyn Records III, MD; patent coronary arteries  but with area of kinking obstructing the LCX to 50%. EF nl        Current Outpatient Prescriptions  Medication Sig Dispense Refill  . acetaminophen (TYLENOL) 325 MG tablet Take 2 tablets (650 mg total) by mouth every 4 (four) hours as needed for headache or mild  pain.    Marland Kitchen amLODipine (NORVASC) 5 MG tablet TAKE 1 TABLET (5 MG TOTAL) BY MOUTH 2 (TWO) TIMES DAILY. 60 tablet 5  . aspirin 81 MG chewable tablet Chew 1 tablet (81 mg total) by mouth daily.    Marland Kitchen atorvastatin (LIPITOR) 20 MG tablet Take 1 tablet (20 mg total) by mouth daily. 30 tablet 6  . nebivolol (BYSTOLIC) 2.5 MG tablet Take 1 tablet (2.5 mg total) by mouth daily. 30 tablet 0   No current facility-administered medications for this visit.    Allergies:   Enalapril and Neurontin    Social History:  The patient  reports that he has never smoked. He has never used smokeless tobacco. He reports that he does not drink alcohol or use illicit drugs.   Family History:  The patient's family history includes Cancer in his sister; Heart failure in his father; Hypertension in his father and mother; Lung cancer in his sister; Pancreatic cancer in his sister.    ROS:  Please see the history of present illness. All other systems are reviewed and negative.    PHYSICAL EXAM: VS:  BP 128/62 mmHg  Pulse 76  Ht  (1.854 m)  Wt 213 lb 14.4 oz (97.024 kg)  BMI 28.23 kg/m2 , BMI Body mass index is 28.23 kg/(m^2). GEN: Well nourished, well developed, male in no acute distress HEENT: normal Neck: no JVD, carotid bruits, or masses Cardiac: RRR; soft murmur, no rubs, or gallops,no edema  Respiratory:  clear to auscultation bilaterally, normal work of breathing GI: soft, nontender, nondistended, + BS MS: no deformity or atrophy; distal pulses are 2+ in all 4 extremities. Skin: warm and dry, no rash Neuro:  Strength and sensation are intact Psych: euthymic mood, full affect   EKG:  EKG is not ordered today.  Echo: 08/07/2014 Conclusions - Left ventricle: The cavity size was normal. Wall thickness was increased in a pattern of mild LVH. Systolic function was normal. The estimated ejection fraction was in the range of 55% to 60%. Wall motion was normal; there were no regional wall  motion abnormalities. Features are consistent with a pseudonormal left ventricular filling pattern, with concomitant abnormal relaxation and increased filling pressure (grade 2 diastolic dysfunction). - Aortic valve: There was no stenosis. There was trivial regurgitation. - Aorta: Dilated aortic root. Aortic root dimension: 44 mm (ED). - Mitral valve: There was trivial regurgitation. - Right ventricle: The cavity size was normal. Systolic function was normal. - Right atrium: Prominent Chiari network. - Pulmonary arteries: No complete TR doppler jet so unable to estimate PA systolic pressure. - Inferior vena cava: The vessel was normal in size. The respirophasic diameter changes were in the normal range (= 50%), consistent with normal central venous pressure. - Impressions: Normal LV size with mild LV hypertrophy. EF 55-60% with moderate diastolic dysfunction. Normal RV size and systolic  function. No significant valvular abnormalities. Dilated aorticroot. Impressions: - Normal LV size with mild LV hypertrophy. EF 55-60% with moderate diastolic dysfunction. Normal RV size and systolic function. No significant valvular abnormalities. Dilated aortic root.  Recent Labs: 08/07/2014: ALT 17; TSH 0.841 12/20/2014: BUN 11; Creatinine, Ser 1.11; Hemoglobin 13.7; Magnesium 2.2; Platelets 248; Potassium 3.1*; Sodium 140    Lipid Panel    Component Value Date/Time   CHOL 183 08/07/2014 0958   TRIG 103 08/07/2014 0958   HDL 42 08/07/2014 0958   CHOLHDL 4.4 08/07/2014 0958   VLDL 21 08/07/2014 0958   LDLCALC 120* 08/07/2014 0958     Wt Readings from Last 3 Encounters:  12/23/14 213 lb 14.4 oz (97.024 kg)  12/20/14 214 lb (97.07 kg)  10/06/14 217 lb 9.6 oz (98.703 kg)     Other studies Reviewed: Additional studies/ records that were reviewed today include: Hospital records, previous ECGs, cath report and echo report.  ASSESSMENT AND PLAN:  1.  Presyncope: His  symptoms are consistent with micturition presyncope. He has documented bradycardia during the episode that happened in the emergency room, but his resting heart rate is normal. He has no documented bradycardia on his home testing. However, because of the ECG with a heart rate of 42 and nurse's notes with heart rates in the 20s, we will order an event monitor. We will follow-up on these results.  He is to try behavioral modifications with limiting evening fluid intake, urinating just before going to bed and sitting on the toilet when he is urinating at night. His wife is encouraged to try to get a blood pressure on them whenever he is having an episode so we can try to document the frequency and severity of the vital sign changes in conjunction with his symptoms.  2. Hypertension: He is on a very low dose of by systolic. His resting heart rate is in the 60s, with no evidence of underlying conduction system disease. We will continue this low-dose beta blocker as well as the amlodipine for now. To facilitate better blood pressure control, we will try spironolactone at 25 mg daily. To get a BMET in a week which she can get when he comes in to get his 30 day monitor placed.   Current medicines are reviewed at length with the patient today.  The patient does not have concerns regarding medicines.  The following changes have been made:  Add spironolactone  Labs/ tests ordered today include:   Orders Placed This Encounter  Procedures  . Basic metabolic panel     Disposition:   FU with me or Dr. Tresa Endo in 6 weeks after the event monitor.   [name]  12/23/2014 4:51 PM    [facility] [address] [place], [place], [place]  [address] Phone: [phone]; Fax: [phone]

## 2014-12-23 NOTE — Patient Instructions (Signed)
Your physician has recommended you make the following change in your medication: START Spironolactone 25 mg daily  Your physician recommends that you return for lab work in: 1 Week BMP  Limit your evening fluids intake, Urinate pre-sleep and sit on toilet when urinating at night.  Your physician recommends that you schedule a follow-up appointment in: Nurse visit next week for 30 day event monitor and follow-up after

## 2014-12-28 ENCOUNTER — Telehealth: Payer: Self-pay | Admitting: Cardiology

## 2014-12-28 ENCOUNTER — Emergency Department (HOSPITAL_COMMUNITY): Payer: Self-pay

## 2014-12-28 ENCOUNTER — Encounter (HOSPITAL_COMMUNITY): Payer: Self-pay | Admitting: Nurse Practitioner

## 2014-12-28 ENCOUNTER — Emergency Department (HOSPITAL_COMMUNITY)
Admission: EM | Admit: 2014-12-28 | Discharge: 2014-12-28 | Disposition: A | Discharge status: Home / Self Care | Payer: Self-pay | Attending: Emergency Medicine | Admitting: Emergency Medicine

## 2014-12-28 DIAGNOSIS — K219 Gastro-esophageal reflux disease without esophagitis: Secondary | ICD-10-CM | POA: Insufficient documentation

## 2014-12-28 DIAGNOSIS — Z79899 Other long term (current) drug therapy: Secondary | ICD-10-CM | POA: Insufficient documentation

## 2014-12-28 DIAGNOSIS — Z9889 Other specified postprocedural states: Secondary | ICD-10-CM | POA: Insufficient documentation

## 2014-12-28 DIAGNOSIS — Z7982 Long term (current) use of aspirin: Secondary | ICD-10-CM | POA: Insufficient documentation

## 2014-12-28 DIAGNOSIS — I10 Essential (primary) hypertension: Secondary | ICD-10-CM | POA: Insufficient documentation

## 2014-12-28 DIAGNOSIS — R61 Generalized hyperhidrosis: Secondary | ICD-10-CM

## 2014-12-28 DIAGNOSIS — F419 Anxiety disorder, unspecified: Secondary | ICD-10-CM | POA: Insufficient documentation

## 2014-12-28 DIAGNOSIS — R002 Palpitations: Secondary | ICD-10-CM

## 2014-12-28 DIAGNOSIS — R0602 Shortness of breath: Secondary | ICD-10-CM | POA: Insufficient documentation

## 2014-12-28 DIAGNOSIS — R110 Nausea: Secondary | ICD-10-CM | POA: Insufficient documentation

## 2014-12-28 DIAGNOSIS — R42 Dizziness and giddiness: Secondary | ICD-10-CM | POA: Insufficient documentation

## 2014-12-28 DIAGNOSIS — R079 Chest pain, unspecified: Secondary | ICD-10-CM

## 2014-12-28 DIAGNOSIS — Z8739 Personal history of other diseases of the musculoskeletal system and connective tissue: Secondary | ICD-10-CM | POA: Insufficient documentation

## 2014-12-28 DIAGNOSIS — R1030 Lower abdominal pain, unspecified: Secondary | ICD-10-CM | POA: Insufficient documentation

## 2014-12-28 LAB — URINALYSIS, ROUTINE W REFLEX MICROSCOPIC
BILIRUBIN URINE: NEGATIVE
Glucose, UA: NEGATIVE mg/dL
Hgb urine dipstick: NEGATIVE
KETONES UR: NEGATIVE mg/dL
LEUKOCYTES UA: NEGATIVE
NITRITE: NEGATIVE
PH: 5.5 (ref 5.0–8.0)
PROTEIN: NEGATIVE mg/dL
Specific Gravity, Urine: 1.017 (ref 1.005–1.030)
UROBILINOGEN UA: 0.2 mg/dL (ref 0.0–1.0)

## 2014-12-28 LAB — CBC
HCT: 42.6 % (ref 39.0–52.0)
HEMOGLOBIN: 14.2 g/dL (ref 13.0–17.0)
MCH: 29.2 pg (ref 26.0–34.0)
MCHC: 33.3 g/dL (ref 30.0–36.0)
MCV: 87.5 fL (ref 78.0–100.0)
PLATELETS: 273 10*3/uL (ref 150–400)
RBC: 4.87 MIL/uL (ref 4.22–5.81)
RDW: 13.7 % (ref 11.5–15.5)
WBC: 6.8 10*3/uL (ref 4.0–10.5)

## 2014-12-28 LAB — BASIC METABOLIC PANEL
ANION GAP: 5 (ref 5–15)
BUN: 11 mg/dL (ref 6–20)
CALCIUM: 9.8 mg/dL (ref 8.9–10.3)
CHLORIDE: 105 mmol/L (ref 101–111)
CO2: 29 mmol/L (ref 22–32)
CREATININE: 1.31 mg/dL — AB (ref 0.61–1.24)
GFR calc non Af Amer: 59 mL/min — ABNORMAL LOW (ref >60)
Glucose, Bld: 120 mg/dL — ABNORMAL HIGH (ref 65–99)
Potassium: 4 mmol/L (ref 3.5–5.1)
SODIUM: 139 mmol/L (ref 135–145)

## 2014-12-28 LAB — LIPASE, BLOOD: LIPASE: 28 U/L (ref 22–51)

## 2014-12-28 LAB — I-STAT TROPONIN, ED: TROPONIN I, POC: 0 ng/mL (ref 0.00–0.08)

## 2014-12-28 MED ORDER — SODIUM CHLORIDE 0.9 % IV BOLUS (SEPSIS)
1000.0000 mL | Freq: Once | INTRAVENOUS | Status: AC
  Administered 2014-12-28: 1000 mL via INTRAVENOUS

## 2014-12-28 NOTE — Telephone Encounter (Signed)
Wife called back, husband has been sick all week, now with abd pain, BP more elevated, nausea, diaphoresis, hx of pancreatitis in family.  With continued symptoms I instructed them to go to ER for further eval. She agreed.

## 2014-12-28 NOTE — ED Notes (Signed)
This RN attempted IV x 2.  Will seek additional assistance.   

## 2014-12-28 NOTE — ED Notes (Addendum)
Pt reports episodes of diaphoresis, dizziness, chest tightness, back pain, belching over past week. He was at [facility] last week for bradycardia. Family checked and found his bp was low after standing up today and he felt dizzy at that time. He is A&ox4, resp e/u. He is also asking to be checked for pancreatitis because his father died of it and he doesn't think hes been checked for it

## 2014-12-28 NOTE — ED Notes (Signed)
Pt's wife called out and states pt is trembling "again", which occurred prior to a fast heart rate drop he experienced last week at [facility] when his HR went down to 25.  This RN gave pt another warm blanket and verified patient is alert and oriented.  Pt denies changes to chest pressure or any other symptoms other than an obvious tremor.  MD at bedside at this time and made aware of pt's concerns.

## 2014-12-28 NOTE — ED Provider Notes (Signed)
CSN: [id number]     Arrival date & time 12/28/14  1455 History   First MD Initiated Contact with Patient 12/28/14 1513     Chief Complaint  Patient presents with  . Chest Pain    The history is provided by the patient, the spouse and medical records.     59 year old male with history of hypertension, recent admission for symptomatic bradycardia last week, sleep apnea presenting with episodes of diaphoresis and chest tightness. Onset about one week ago. Intermittent symptoms. Worsening over the past week. Worsened by position changes, especially standing up. Alleviated somewhat with rest. Episodes described as sudden onset of profound diaphoresis with lightheadedness and feeling of diffuse chest tightness and nausea. Patient's wife reports during these episodes blood pressure and heart rate change significantly. Also associated with some belching and back pain this week and patient expresses concern over health of his pancreas as he has a family history of pancreatitis that led to his father's death. Of note, patient reports some medication changes recently per cardiology after clinic visit, including starting diuretic. Has been followed closely by cardiology after admission recently.    Past Medical History  Diagnosis Date  . Hypertension   . Reflux   . Spinal stenosis   . S/P cardiac cath 01/29/14     but with area of kinking obstructing the LCX to 50%. other wise patent coronary arteries.  . Sinus bradycardia 2015    Event monitor showed no critical bradycardia, tolerating low-dose beta blocker  . 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 His AHI was 0.4; however, his RDI was 8.2. He had an increased arousals. There was no evidence for nocturnal myoclonus.   Past Surgical History  Procedure Laterality Date  . Cholecystectomy    . Left heart catheterization with coronary  angiogram N/A 01/29/2014    Procedure: LEFT HEART CATHETERIZATION WITH CORONARY ANGIOGRAM;  Surgeon: Lyn Records III, MD; patent coronary arteries  but with area of kinking obstructing the LCX to 50%. EF nl       Family History  Problem Relation Age of Onset  . Heart failure Father   . Hypertension Father   . Lung cancer Sister   . Pancreatic cancer Sister   . Cancer Sister   . Hypertension Mother    Social History  Substance Use Topics  . Smoking status: Never Smoker   . Smokeless tobacco: Never Used  . Alcohol Use: No    Review of Systems  Constitutional: Negative for fever.  HENT: Negative for rhinorrhea.   Eyes: Negative for visual disturbance.  Respiratory: Positive for shortness of breath.   Cardiovascular: Positive for chest pain and palpitations.  Gastrointestinal: Positive for nausea and abdominal pain. Negative for vomiting, diarrhea, constipation and blood in stool.  Genitourinary: Negative for decreased urine volume.  Skin: Negative for rash.  Allergic/Immunologic: Negative for immunocompromised state.  Neurological: Positive for light-headedness. Negative for syncope.  Psychiatric/Behavioral: The patient is nervous/anxious.       Allergies  Enalapril and Neurontin  Home Medications   Prior to Admission medications   Medication Sig Start Date End Date Taking? Authorizing Provider  acetaminophen (TYLENOL) 325 MG tablet Take 2 tablets (650 mg total) by mouth every 4 (four) hours as needed for headache or mild pain. 01/30/14  Yes Leone Brand, NP  amLODipine (NORVASC) 5 MG tablet TAKE 1 TABLET (5 MG TOTAL) BY MOUTH 2 (TWO)  TIMES DAILY. 12/11/14  Yes Leone Brand, NP  aspirin 81 MG chewable tablet Chew 1 tablet (81 mg total) by mouth daily. 01/30/14  Yes Leone Brand, NP  atorvastatin (LIPITOR) 20 MG tablet Take 1 tablet (20 mg total) by mouth daily. 08/14/14  Yes Lennette Bihari, MD  ciprofloxacin (CIPRO) 500 MG tablet Take 500 mg by mouth 2 (two) times daily.  12/27/14 01/03/15 Yes Historical Provider, MD  nebivolol (BYSTOLIC) 2.5 MG tablet Take 1 tablet (2.5 mg total) by mouth daily. 12/20/14  Yes Raeford Razor, MD  ranitidine (ZANTAC) 150 MG tablet Take 1 tablet (150 mg total) by mouth 2 (two) times daily. 12/23/14  Yes Rhonda G Barrett, PA-C  spironolactone (ALDACTONE) 25 MG tablet Take 1 tablet (25 mg total) by mouth daily. 12/23/14  Yes Rhonda G Barrett, PA-C   BP 143/84 mmHg  Pulse 69  Temp(Src) 98.4 F (36.9 C) (Oral)  Resp 14  SpO2 98% Physical Exam  Constitutional: He is oriented to person, place, and time. He appears well-developed and well-nourished. No distress.  HENT:  Head: Normocephalic and atraumatic.  Eyes: Right eye exhibits no discharge. Left eye exhibits no discharge.  Neck: No tracheal deviation present.  Cardiovascular: Normal rate, regular rhythm and intact distal pulses.   Pulmonary/Chest: Effort normal and breath sounds normal. No respiratory distress.  Abdominal: Soft. Bowel sounds are normal. He exhibits no distension. There is tenderness (mildly ttp suprapubic area). There is no rebound.  Musculoskeletal: He exhibits no edema.  Neurological: He is alert and oriented to person, place, and time. No cranial nerve deficit.  Skin: Skin is warm and dry.  Psychiatric: He has a normal mood and affect. His behavior is normal.    ED Course  Procedures (including critical care time) Labs Review Labs Reviewed  BASIC METABOLIC PANEL - Abnormal; Notable for the following:    Glucose, Bld 120 (*)    Creatinine, Ser 1.31 (*)    GFR calc non Af Amer 59 (*)    All other components within normal limits  CBC  LIPASE, BLOOD  URINALYSIS, ROUTINE W REFLEX MICROSCOPIC (NOT AT [facility])  Rosezena Sensor, ED    Imaging Review Dg Chest Port 1 View  12/28/2014   CLINICAL DATA:  Chest pain.  EXAM: PORTABLE CHEST 1 VIEW  COMPARISON:  12/20/2014 chest radiograph  FINDINGS: Stable cardiomediastinal silhouette with normal heart size. No  pneumothorax. No pleural effusion. Clear lungs, with no focal lung consolidation and no pulmonary edema.  IMPRESSION: No active disease in the chest.   Electronically Signed   By: Delbert Phenix M.D.   On: 12/28/2014 18:36   I have personally reviewed and evaluated these images and lab results as part of my medical decision-making.   EKG Interpretation None      MDM   Final diagnoses:  Palpitation  Diaphoresis   59 year old male with history of hypertension, recent admission for symptomatic bradycardia last week, sleep apnea presenting with episodes of diaphoresis and chest tightness, presyncope, BP and HR changes with position change, abdominal pain. AF, VSS, unremarkable physical exam. Workup notable for no concerning EKG changes or new consolidations on CXR.  Mildly elevated creatinine. Shared these results with the patient and administered 1 L bolus. Symptoms possibly partly attributable to dehydration after starting diuretic. Troponin negative, no EKG changes, doubt ischemia/ACS. As symptoms have been present for over a week, patient is stable, and is followed closely by cardiology, discharged with close cardiology follow-up as scheduled to discuss medications  and current symptoms. Return precautions given. Also plan follow up with PCP. Dc in stable condition.   Case discussed with Dr. Rubin Payor who oversaw management of this pt.    Urban Gibson, MD 12/28/14 2352  Benjiman Core, MD 12/29/14 [phone]

## 2014-12-28 NOTE — Telephone Encounter (Signed)
Pt's wife called about her husband having pain- note on pager.  I called back with no answer. I left message and left message at home number.

## 2014-12-28 NOTE — ED Notes (Signed)
Dr. Pickering at bedside at this time 

## 2014-12-28 NOTE — ED Notes (Signed)
MD at bedside updating pt.  Pt's wife had asked RN if the MD might see a benefit in keeping patient overnight.  MD addressed these concerns with pt during interaction.

## 2015-04-07 ENCOUNTER — Ambulatory Visit (INDEPENDENT_AMBULATORY_CARE_PROVIDER_SITE_OTHER): Payer: BLUE CROSS/BLUE SHIELD | Admitting: Cardiovascular Disease

## 2015-04-07 ENCOUNTER — Encounter: Payer: Self-pay | Admitting: Cardiovascular Disease

## 2015-04-07 VITALS — BP 122/80 | HR 55 | Ht 73.0 in | Wt 220.0 lb

## 2015-04-07 DIAGNOSIS — E785 Hyperlipidemia, unspecified: Secondary | ICD-10-CM | POA: Diagnosis not present

## 2015-04-07 DIAGNOSIS — I25810 Atherosclerosis of coronary artery bypass graft(s) without angina pectoris: Secondary | ICD-10-CM

## 2015-04-07 DIAGNOSIS — R001 Bradycardia, unspecified: Secondary | ICD-10-CM | POA: Diagnosis not present

## 2015-04-07 DIAGNOSIS — I10 Essential (primary) hypertension: Secondary | ICD-10-CM

## 2015-04-07 NOTE — Patient Instructions (Signed)
Your physician wants you to follow-up in: 6 months or sooner if needed. You will receive a reminder letter in the mail two months in advance. If you don't receive a letter, please call our office to schedule the follow-up appointment.   If you need a refill on your cardiac medications before your next appointment, please call your pharmacy. 

## 2015-04-07 NOTE — Progress Notes (Signed)
Patient ID: Geoffrey Hynes, male   DOB: 05/03/1955, 60 y.o.   MRN: [id number]     HPI: Blandon Offerdahl is a 60 y.o. male who presents to the office today for a 82-monthfollow-up cardiology evaluation.  Mr. WTuis a [place]  [place] [place] [facility] [place] after being awakened from a deep sleep with substernal chest pressure associated with diaphoresis and mild shortness of breath.  He has a history of accelerated hypertension and previously  had blood pressures in excess of 200 bpm.  His ECG on presentation did not reveal acute ST segment changes.  He continue to experience residual episodes of chest pain and cardiology consultation was recommended.  His cardiac risk factors were notable for long-standing history of hypertension as well as family history for heart disease.  I recommended definitive cardiac catheterization.  Catheterization was done by Dr. STamala Julianand both the right and circumflex coronary arteries contained a region of kinking that in one view appeared to obstructive vessel up to 50%.  Normal LV function.  Ejection fraction was 55-60%.  He was seen in follow-up of his hospitalization by LCecilie Kickson 03/05/2014.  He did not experience recurrent chest pain.  He had been on a PPI.  He had had some bradycardia in the hospital and a subsequent event monitor did not reveal significant bradycardia arrhythmia and he was restarted back on metoprolol at 25 mg twice a day.  Due to concerns for sleep apnea particularly with his chest pain awakening him from sleep he underwent a sleep study on November 12 23 2015.  This suggested increased upper airway resistance syndrome but definitive sleep apnea was not demonstrated.  His AHI was 0.4; however, his RDI was 8.2.  He had an increased arousals.  There was no evidence for nocturnal myoclonus.  When I last saw him last year his blood pressure was mildly increased at 148/88.  Since he did have some issues with erectile dysfunction on metoprolol  [facility] initiation of 5 mg daily. An echo Doppler study  on 08/07/2014  showed an ejection fraction of 506-30% grade 2 diastolic dysfunction, trivial aortic insufficiency and mild dilatation of his aortic root at 44 mm.  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  When I saw him, I reviewed his laboratory and his lipid studies revealed a cholesterol 183 LDL cholesterol 120, HDL 42 and triglycerides 103.  He was started on atorvastatin 20 mg.  He subsequently was seen by RRosaria Ferriesand was started on spironolactone for improved blood pressure control.  He tells me he recently saw his primary physician [place]. [place]who [place]  Apparently blood work was recently done.  When I review the patient's  Medication profile.  He no longer is taking amlodipine, but is now taking Cardizem CD at 2160mg daily, Bystolic 2.5 mg, and Aldactone 25 mg.  It does not appear that he is taking atorvastatin.  I do not know the results of recent laboratory. He states there are times she still wakes up and when he goes to the bathroom he gets sweaty feels that his blood pressure is labile. He presents for evaluation.   Past Medical History  Diagnosis Date  . Hypertension   . Reflux   . Spinal stenosis   . S/P cardiac cath 01/29/14  but with area of kinking obstructing the LCX to 50%. other wise patent coronary arteries.  . Sinus bradycardia 2015    Event monitor showed no critical bradycardia, tolerating low-dose beta blocker  . 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 His AHI was 0.4; however, his RDI was 8.2. He had an increased arousals. There was no evidence for nocturnal myoclonus.    Past Surgical History  Procedure Laterality Date   . Cholecystectomy    . Left heart catheterization with coronary angiogram N/A 01/29/2014    Procedure: LEFT HEART CATHETERIZATION WITH CORONARY ANGIOGRAM;  Surgeon: Belva Crome III, MD; patent coronary arteries  but with area of kinking obstructing the LCX to 50%. EF nl        Allergies  Allergen Reactions  . Enalapril Cough  . Neurontin [Gabapentin] Palpitations    Current Outpatient Prescriptions  Medication Sig Dispense Refill  . diltiazem (CARDIZEM CD) 240 MG 24 hr capsule Take 240 mg by mouth daily.    . naproxen sodium (ANAPROX) 220 MG tablet Take 220 mg by mouth as needed.    . nebivolol (BYSTOLIC) 2.5 MG tablet Take 1 tablet (2.5 mg total) by mouth daily. (Patient taking differently: Take 0.25 mg by mouth daily. ) 30 tablet 0  . ranitidine (ZANTAC) 150 MG tablet Take 1 tablet (150 mg total) by mouth 2 (two) times daily. 60 tablet 6  . spironolactone (ALDACTONE) 25 MG tablet Take 1 tablet (25 mg total) by mouth daily. 30 tablet 6   No current facility-administered medications for this visit.    Social History   Social History  . Marital Status: Married    Spouse Name: N/A  . Number of Children: N/A  . Years of Education: N/A   Occupational History  . Not on file.   Social History Main Topics  . Smoking status: Never Smoker   . Smokeless tobacco: Never Used  . Alcohol Use: No  . Drug Use: No  . Sexual Activity: Yes   Other Topics Concern  . Not on file   Social History Narrative   Social history is notable that he is married, has 5 children.  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  He now lives in [place].  There is no tobacco use.  Family History  Problem Relation Age of Onset  . Heart failure Father   . Hypertension Father   . Lung cancer Sister   . Pancreatic cancer Sister   . Cancer Sister   . Hypertension Mother    Family history is also notable that his mother is still living at age 69.  Father died at age 28 and had  undergone CABG surgery at 51.  He has 5 siblings and several sisters who have had SVT.  One sister was recently diagnosed with adrenal gland issues.  ROS General: Negative; No fevers, chills, or night sweats HEENT: Negative; No changes in vision or hearing, sinus congestion, difficulty swallowing Pulmonary: Negative; No cough, wheezing, shortness of breath, hemoptysis Cardiovascular: See HPI:  GI: Negative; No nausea, vomiting, diarrhea, or abdominal pain GU: Negative; No dysuria, hematuria, or difficulty voiding Musculoskeletal: Negative; no myalgias, joint pain, or weakness Hematologic: Negative; no easy bruising, bleeding Endocrine: Negative; no heat/cold intolerance; no diabetes, Neuro: Negative; no changes in balance, headaches Skin: Negative; No rashes or skin lesions Psychiatric: Negative; No behavioral problems, depression Sleep: Negative; No snoring,  daytime sleepiness, hypersomnolence, bruxism, restless legs, hypnogognic hallucinations. Other comprehensive 14 point system review is negative   Physical Exam BP 122/80 mmHg  Pulse 55  Ht '6\' 1"'$  (1.854 m)  Wt 220 lb (99.791 kg)  BMI 29.03 kg/m2   Repeat blood pressure 122/72  Supine and 122/70 standing.  No significant orthostatic.  Pulse rise. Wt Readings from Last 3 Encounters:  04/07/15 220 lb (99.791 kg)  12/23/14 213 lb 14.4 oz (97.024 kg)  12/20/14 214 lb (97.07 kg)   General: Alert, oriented, no distress.  Skin: normal turgor, no rashes, warm and dry HEENT: Normocephalic, atraumatic. Pupils equal round and reactive to light; sclera anicteric; extraocular muscles intact, No lid lag; Nose without nasal septal hypertrophy; Mouth/Parynx benign; Mallinpatti scale 2/3 Neck: No JVD, no carotid bruits; normal carotid upstroke Lungs: clear to ausculatation and percussion bilaterally; no wheezing or rales, normal inspiratory and expiratory effort Chest wall: without tenderness to palpitation Heart: PMI not displaced, RRR, s1  s2 normal, 1/6 systolic murmur, No diastolic murmur, no rubs, gallops, thrills, or heaves Abdomen: soft, nontender; no hepatosplenomehaly, BS+; abdominal aorta nontender and not dilated by palpation. Back: no CVA tenderness Pulses: 2+  Musculoskeletal: full range of motion, normal strength, no joint deformities Extremities: Pulses 2+, no clubbing cyanosis or edema, Homan's sign negative  Neurologic: grossly nonfocal; Cranial nerves grossly wnl Psychologic: Normal mood and affect  ECG (independently read by me): Sinus bradycardia 55 bpm.  Borderline first-degree AV block with a PR interval at 204 ms.  No ST-T changes  July 2016 ECG (independently read by me):  Normal sinus rhythm at 63 bpm.  Borderline first-degree AV block with a PR interval at 206 ms.  ECG (independently read by me): Normal sinus rhythm at 68 bpm.  Nonspecific ST changes in lead 3.  QTc interval 414 ms.  LABS:  BMP Latest Ref Rng 12/28/2014 12/20/2014 08/07/2014  Glucose 65 - 99 mg/dL 120(H) 138(H) 91  BUN 6 - 20 mg/dL '11 11 11  '$ Creatinine 0.61 - 1.24 mg/dL 1.31(H) 1.11 1.07  Sodium 135 - 145 mmol/L 139 140 141  Potassium 3.5 - 5.1 mmol/L 4.0 3.1(L) 3.9  Chloride 101 - 111 mmol/L 105 107 103  CO2 22 - 32 mmol/L '29 25 27  '$ Calcium 8.9 - 10.3 mg/dL 9.8 9.1 9.9     Hepatic Function Latest Ref Rng 08/07/2014 01/29/2014 11/02/2013  Total Protein 6.0 - 8.3 g/dL 7.8 7.3 7.4  Albumin 3.5 - 5.2 g/dL 4.5 3.7 3.9  AST 0 - 37 U/L '17 22 20  '$ ALT 0 - 53 U/L '17 21 18  '$ Alk Phosphatase 39 - 117 U/L 81 66 73  Total Bilirubin 0.2 - 1.2 mg/dL 0.8 0.4 0.3  Bilirubin, Direct 0.0 - 0.3 mg/dL - <0.2 -    CBC Latest Ref Rng 12/28/2014 12/20/2014 08/07/2014  WBC 4.0 - 10.5 K/uL 6.8 6.0 6.1  Hemoglobin 13.0 - 17.0 g/dL 14.2 13.7 13.7  Hematocrit 39.0 - 52.0 % 42.6 41.5 41.2  Platelets 150 - 400 K/uL 273 248 286   Lab Results  Component Value Date   MCV 87.5 12/28/2014   MCV 87.4 12/20/2014   MCV 85.8 08/07/2014    BNP No results found  for: BNP  ProBNP    Component Value Date/Time   PROBNP <30.0 04/16/2007 0253     Lipid Panel     Component Value Date/Time   CHOL 183 08/07/2014 0958   TRIG 103 08/07/2014 0958   HDL 42 08/07/2014 0958  CHOLHDL 4.4 08/07/2014 0958   VLDL 21 08/07/2014 0958   LDLCALC 120* 08/07/2014 0958     RADIOLOGY: No results found.    ASSESSMENT AND PLAN: Mr. 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  In November 2015 he was awakened with substernal chest tightness associated with diaphoresis and shortness of breath.  Cardiac catheterization revealed 30-50% areas of apparent kinking in the circumflex and RCA.   He had been on amlodipine as well as beta blocker therapy since his catheterization.  Recently his amlodipine was discontinued by his primary physician due to concerns of burning and tingling in his chest. I'm not certain that this was related to amlodipine.  His erectile dysfunction issues.  Did improve with changing from metoprolol to Bystolic and he continues to be at a low-dose and is currently bradycardic  I reviewed his echo Doppler data which shows normal systolic function, probable grade 2 diastolic dysfunction, and mild aortic root dilatation.  I discussed reduced LV compliance with both he and his wife today potential for exertional shortness of breath. It does not appear that he is taking atorvastatin any further based on his medication list.  I'll try to obtain recent blood work that has been done by his primary physician.  He tells me his recent blood sugar was improved at 89.  He has GERD but this has been controlled with Zantac 150 mg.  I will see him in 6 months for cardiology evaluation.  Time spent: 25 minutes  Troy Sine, MD, [facility]  04/07/2015 5:49 PM

## 2015-06-29 ENCOUNTER — Telehealth: Payer: Self-pay

## 2015-06-29 NOTE — Telephone Encounter (Signed)
Prior auth for Bystolic 10mg  1/2 tab daily sent to [facility].

## 2015-07-01 ENCOUNTER — Other Ambulatory Visit: Payer: Self-pay | Admitting: *Deleted

## 2015-07-01 MED ORDER — NEBIVOLOL HCL 2.5 MG PO TABS: 2.5000 mg | ORAL_TABLET | Freq: Every day | ORAL | Status: DC

## 2015-07-01 NOTE — Telephone Encounter (Signed)
Rx(s) sent to pharmacy electronically.  

## 2015-07-06 ENCOUNTER — Other Ambulatory Visit: Payer: Self-pay | Admitting: Family Medicine

## 2015-07-06 DIAGNOSIS — E041 Nontoxic single thyroid nodule: Secondary | ICD-10-CM

## 2015-07-07 ENCOUNTER — Telehealth: Payer: Self-pay

## 2015-07-07 NOTE — Telephone Encounter (Signed)
Bystolic has been denied per [employer]. Their rationale is that he has only tried and failed one formulary alternative. Had taken Metoprolol only. Needs to try Atenolol or Carvedilol.

## 2015-07-09 ENCOUNTER — Ambulatory Visit
Admission: RE | Admit: 2015-07-09 | Discharge: 2015-07-09 | Disposition: A | Discharge status: Home / Self Care | Payer: BLUE CROSS/BLUE SHIELD | Source: Ambulatory Visit | Attending: Family Medicine | Admitting: Family Medicine

## 2015-07-09 DIAGNOSIS — E041 Nontoxic single thyroid nodule: Secondary | ICD-10-CM

## 2015-07-13 ENCOUNTER — Other Ambulatory Visit: Payer: Self-pay

## 2015-07-13 MED ORDER — CARVEDILOL 3.125 MG PO TABS: 3.1250 mg | ORAL_TABLET | Freq: Two times a day (BID) | ORAL | Status: DC

## 2015-07-13 NOTE — Telephone Encounter (Signed)
Patient notified. Will send Rx for Carvedilol 3.125 to [facility] [address]

## 2015-07-13 NOTE — Telephone Encounter (Signed)
Can try carvedilol 3.125 mg bid.

## 2015-07-30 ENCOUNTER — Other Ambulatory Visit: Payer: Self-pay | Admitting: Physician Assistant

## 2015-07-31 NOTE — Telephone Encounter (Signed)
Rx(s) sent to pharmacy electronically.  

## 2015-08-03 ENCOUNTER — Telehealth: Payer: Self-pay | Admitting: Cardiovascular Disease

## 2015-08-03 MED ORDER — NEBIVOLOL HCL 5 MG PO TABS: 5.0000 mg | ORAL_TABLET | Freq: Every day | ORAL | Status: DC

## 2015-08-03 NOTE — Telephone Encounter (Signed)
Pt was recently started on carvedilol. Notes since going on this medication 2-3 weeks ago, he has had runny nose and nosebleeds after he blows his nose. Reviewed all meds - confirmed as listed. He denies regular naproxen use - states he uses this occasionally.  A PA was recently denied for bystolic, hence his being put on carvedilol. States he is unsure if he would be candidate for metoprolol - "failed" this drug per notes, but he is not sure why.  Pt aware I am not familiar w his SE's being associated w/ this medication but would send to pharmD to review.

## 2015-08-03 NOTE — Telephone Encounter (Signed)
Please call,having problem with his Coreg. His nose started bleeding and his joints are hurting.

## 2015-08-03 NOTE — Telephone Encounter (Signed)
Called pt and informed him to remain on carvedilol for now and we would submit prior authorization for bystolic.  Spoke w/ Belenda CruiseKristin and confirmed recommended dosing for bystolic at 5mg  daily.  Prior auth submitted.

## 2015-08-03 NOTE — Telephone Encounter (Signed)
Carvedilol can cause nasopharyngitis and rhinitis.  Could try for PA on Bystolic now, as was originally denied due to not having failed 2 different beta blockers.

## 2015-08-03 NOTE — Telephone Encounter (Signed)
Received PA approval notification. Informed patient, and called med into pharmacy.  Pt verbalized instructions for starting new med and is aware to call for any concerns, including BP changes or new SE's.

## 2015-08-06 ENCOUNTER — Other Ambulatory Visit: Payer: Self-pay | Admitting: General Surgery

## 2015-08-06 DIAGNOSIS — E041 Nontoxic single thyroid nodule: Secondary | ICD-10-CM

## 2015-08-12 ENCOUNTER — Ambulatory Visit
Admission: RE | Admit: 2015-08-12 | Discharge: 2015-08-12 | Disposition: A | Discharge status: Home / Self Care | Payer: BLUE CROSS/BLUE SHIELD | Source: Ambulatory Visit | Attending: General Surgery | Admitting: General Surgery

## 2015-08-12 ENCOUNTER — Other Ambulatory Visit (HOSPITAL_COMMUNITY)
Admission: RE | Admit: 2015-08-12 | Discharge: 2015-08-12 | Disposition: A | Discharge status: Home / Self Care | Payer: BLUE CROSS/BLUE SHIELD | Source: Ambulatory Visit | Attending: Physician Assistant | Admitting: Physician Assistant

## 2015-08-12 DIAGNOSIS — E041 Nontoxic single thyroid nodule: Secondary | ICD-10-CM

## 2015-08-12 NOTE — Procedures (Signed)
Using direct ultrasound guidance, 4 passes were made using 25 gauge needles into the nodule within the right lobe of the thyroid.   Ultrasound was used to confirm needle placements on all occasions.   Specimens were sent to Pathology for analysis.   Orlen Leedy S Audrionna Lampton PA-C 08/12/2015 11:47 AM

## 2015-08-25 ENCOUNTER — Other Ambulatory Visit: Payer: Self-pay | Admitting: General Surgery

## 2015-08-25 ENCOUNTER — Telehealth: Payer: Self-pay | Admitting: General Surgery

## 2015-08-25 DIAGNOSIS — E0590 Thyrotoxicosis, unspecified without thyrotoxic crisis or storm: Secondary | ICD-10-CM

## 2015-08-25 DIAGNOSIS — E042 Nontoxic multinodular goiter: Secondary | ICD-10-CM

## 2015-08-25 NOTE — Telephone Encounter (Signed)
Called pt to discuss w/u to date. Apologized for delay.  His TSH is now low on our lab check at [facility]. TSH was 0.15, T3 and FT4 normal at 95 and 1.2 respectively consistent with hyperthyroidism. He had low normal TSH in the fall at his PCP that is why I proceeded with FNA of largest nodule at time of visit. If had known thyroid was now abnormal, would have ordered thyroid scan instead. Explained to pt that he has hyperthyroidism and next step should be thyroid scan to see if nodules are hot, cold, warm.  His FNA bx was bethasda I which is nondiagnostic meaning didn't get enough cells to give dx. Explained to pt that I would not recommend repeat bx at this time since we now know he has hyperthyroidism. Also u/s features of large nodule were low suspicion. Recommend getting thyroid scan and referral to endocrinology. He and his wife voiced understanding and agreement. He has upcoming Cerv[facility] which is moderate risk for spinal complications given condition of spine in that area per pt wife. Advised her I would ask endocrinology if his untreated hyperthyroidism could be pose anesthetic risk.

## 2015-08-31 ENCOUNTER — Encounter (HOSPITAL_COMMUNITY): Payer: Self-pay

## 2015-08-31 ENCOUNTER — Encounter (HOSPITAL_COMMUNITY)
Admission: RE | Admit: 2015-08-31 | Discharge: 2015-08-31 | Disposition: A | Discharge status: Home / Self Care | Payer: BLUE CROSS/BLUE SHIELD | Source: Ambulatory Visit | Attending: General Surgery | Admitting: General Surgery

## 2015-08-31 DIAGNOSIS — E042 Nontoxic multinodular goiter: Secondary | ICD-10-CM | POA: Insufficient documentation

## 2015-08-31 DIAGNOSIS — E0590 Thyrotoxicosis, unspecified without thyrotoxic crisis or storm: Secondary | ICD-10-CM | POA: Insufficient documentation

## 2015-08-31 MED ORDER — SODIUM IODIDE I 131 CAPSULE
14.0000 | Freq: Once | INTRAVENOUS | Status: AC | PRN
  Administered 2015-08-31: 14 via ORAL

## 2015-09-01 ENCOUNTER — Encounter (HOSPITAL_COMMUNITY)
Admission: RE | Admit: 2015-09-01 | Discharge: 2015-09-01 | Disposition: A | Discharge status: Home / Self Care | Payer: BLUE CROSS/BLUE SHIELD | Source: Ambulatory Visit | Attending: General Surgery | Admitting: General Surgery

## 2015-09-01 DIAGNOSIS — E0590 Thyrotoxicosis, unspecified without thyrotoxic crisis or storm: Secondary | ICD-10-CM | POA: Diagnosis present

## 2015-09-01 DIAGNOSIS — E042 Nontoxic multinodular goiter: Secondary | ICD-10-CM | POA: Diagnosis not present

## 2015-09-01 MED ORDER — SODIUM PERTECHNETATE TC 99M INJECTION
10.0000 | Freq: Once | INTRAVENOUS | Status: AC | PRN
  Administered 2015-09-01: 10 via INTRAVENOUS

## 2015-09-15 ENCOUNTER — Encounter (HOSPITAL_COMMUNITY): Payer: Self-pay | Admitting: *Deleted

## 2015-09-15 ENCOUNTER — Other Ambulatory Visit (HOSPITAL_COMMUNITY): Payer: Self-pay

## 2015-09-15 ENCOUNTER — Emergency Department (HOSPITAL_COMMUNITY): Payer: BLUE CROSS/BLUE SHIELD

## 2015-09-15 ENCOUNTER — Other Ambulatory Visit: Payer: Self-pay

## 2015-09-15 ENCOUNTER — Observation Stay (HOSPITAL_COMMUNITY)
Admission: EM | Admit: 2015-09-15 | Discharge: 2015-09-16 | Disposition: A | Discharge status: Home / Self Care | Payer: BLUE CROSS/BLUE SHIELD | Attending: Student in an Organized Health Care Education/Training Program | Admitting: Student in an Organized Health Care Education/Training Program

## 2015-09-15 DIAGNOSIS — M5410 Radiculopathy, site unspecified: Secondary | ICD-10-CM | POA: Insufficient documentation

## 2015-09-15 DIAGNOSIS — M4722 Other spondylosis with radiculopathy, cervical region: Secondary | ICD-10-CM

## 2015-09-15 DIAGNOSIS — E0520 Thyrotoxicosis with toxic multinodular goiter without thyrotoxic crisis or storm: Secondary | ICD-10-CM | POA: Diagnosis not present

## 2015-09-15 DIAGNOSIS — R4182 Altered mental status, unspecified: Secondary | ICD-10-CM | POA: Diagnosis not present

## 2015-09-15 DIAGNOSIS — R55 Syncope and collapse: Secondary | ICD-10-CM | POA: Diagnosis not present

## 2015-09-15 DIAGNOSIS — K219 Gastro-esophageal reflux disease without esophagitis: Secondary | ICD-10-CM | POA: Insufficient documentation

## 2015-09-15 DIAGNOSIS — K5900 Constipation, unspecified: Secondary | ICD-10-CM | POA: Insufficient documentation

## 2015-09-15 DIAGNOSIS — I10 Essential (primary) hypertension: Secondary | ICD-10-CM | POA: Insufficient documentation

## 2015-09-15 DIAGNOSIS — M479 Spondylosis, unspecified: Secondary | ICD-10-CM | POA: Insufficient documentation

## 2015-09-15 DIAGNOSIS — F419 Anxiety disorder, unspecified: Secondary | ICD-10-CM | POA: Diagnosis not present

## 2015-09-15 DIAGNOSIS — R740 Nonspecific elevation of levels of transaminase and lactic acid dehydrogenase [LDH]: Secondary | ICD-10-CM

## 2015-09-15 DIAGNOSIS — Z981 Arthrodesis status: Secondary | ICD-10-CM

## 2015-09-15 DIAGNOSIS — R945 Abnormal results of liver function studies: Secondary | ICD-10-CM

## 2015-09-15 DIAGNOSIS — R7989 Other specified abnormal findings of blood chemistry: Secondary | ICD-10-CM

## 2015-09-15 LAB — CBC WITH DIFFERENTIAL/PLATELET
BASOS ABS: 0 10*3/uL (ref 0.0–0.1)
BASOS PCT: 0 %
EOS PCT: 1 %
Eosinophils Absolute: 0.1 10*3/uL (ref 0.0–0.7)
HCT: 42.6 % (ref 39.0–52.0)
Hemoglobin: 13.6 g/dL (ref 13.0–17.0)
Lymphocytes Relative: 20 %
Lymphs Abs: 2.2 10*3/uL (ref 0.7–4.0)
MCH: 28.8 pg (ref 26.0–34.0)
MCHC: 31.9 g/dL (ref 30.0–36.0)
MCV: 90.3 fL (ref 78.0–100.0)
MONO ABS: 0.8 10*3/uL (ref 0.1–1.0)
MONOS PCT: 8 %
Neutro Abs: 7.7 10*3/uL (ref 1.7–7.7)
Neutrophils Relative %: 71 %
PLATELETS: 212 10*3/uL (ref 150–400)
RBC: 4.72 MIL/uL (ref 4.22–5.81)
RDW: 13.6 % (ref 11.5–15.5)
WBC: 10.8 10*3/uL — ABNORMAL HIGH (ref 4.0–10.5)

## 2015-09-15 LAB — URINALYSIS, ROUTINE W REFLEX MICROSCOPIC
Bilirubin Urine: NEGATIVE
Glucose, UA: NEGATIVE mg/dL
HGB URINE DIPSTICK: NEGATIVE
KETONES UR: NEGATIVE mg/dL
LEUKOCYTES UA: NEGATIVE
Nitrite: NEGATIVE
PROTEIN: NEGATIVE mg/dL
Specific Gravity, Urine: 1.016 (ref 1.005–1.030)
pH: 6.5 (ref 5.0–8.0)

## 2015-09-15 LAB — CBC
HCT: 42.4 % (ref 39.0–52.0)
Hemoglobin: 13.8 g/dL (ref 13.0–17.0)
MCH: 28.6 pg (ref 26.0–34.0)
MCHC: 32.5 g/dL (ref 30.0–36.0)
MCV: 87.8 fL (ref 78.0–100.0)
Platelets: 223 10*3/uL (ref 150–400)
RBC: 4.83 MIL/uL (ref 4.22–5.81)
RDW: 13.5 % (ref 11.5–15.5)
WBC: 12 10*3/uL — ABNORMAL HIGH (ref 4.0–10.5)

## 2015-09-15 LAB — RAPID URINE DRUG SCREEN, HOSP PERFORMED
AMPHETAMINES: NOT DETECTED
BARBITURATES: NOT DETECTED
BENZODIAZEPINES: NOT DETECTED
COCAINE: NOT DETECTED
OPIATES: NOT DETECTED
TETRAHYDROCANNABINOL: NOT DETECTED

## 2015-09-15 LAB — I-STAT TROPONIN, ED: TROPONIN I, POC: 0 ng/mL (ref 0.00–0.08)

## 2015-09-15 LAB — COMPREHENSIVE METABOLIC PANEL
ALBUMIN: 3.4 g/dL — AB (ref 3.5–5.0)
ALT: 301 U/L — ABNORMAL HIGH (ref 17–63)
ANION GAP: 8 (ref 5–15)
AST: 108 U/L — AB (ref 15–41)
Alkaline Phosphatase: 110 U/L (ref 38–126)
BILIRUBIN TOTAL: 0.9 mg/dL (ref 0.3–1.2)
BUN: 8 mg/dL (ref 6–20)
CHLORIDE: 102 mmol/L (ref 101–111)
CO2: 29 mmol/L (ref 22–32)
Calcium: 9.2 mg/dL (ref 8.9–10.3)
Creatinine, Ser: 1.16 mg/dL (ref 0.61–1.24)
GFR calc Af Amer: >60 mL/min (ref >60)
Glucose, Bld: 101 mg/dL — ABNORMAL HIGH (ref 65–99)
POTASSIUM: 3.9 mmol/L (ref 3.5–5.1)
Sodium: 139 mmol/L (ref 135–145)
TOTAL PROTEIN: 6.3 g/dL — AB (ref 6.5–8.1)

## 2015-09-15 LAB — TSH: TSH: 0.348 u[IU]/mL — ABNORMAL LOW (ref 0.350–4.500)

## 2015-09-15 LAB — ETHANOL: Alcohol, Ethyl (B): <5 mg/dL (ref <5)

## 2015-09-15 LAB — AMMONIA: AMMONIA: 26 umol/L (ref 9–35)

## 2015-09-15 LAB — CK: Total CK: 61 U/L (ref 49–397)

## 2015-09-15 MED ORDER — NEBIVOLOL HCL 5 MG PO TABS
5.0000 mg | ORAL_TABLET | Freq: Every day | ORAL | Status: DC
  Filled 2015-09-15: qty 1

## 2015-09-15 MED ORDER — PREDNISONE 50 MG PO TABS
50.0000 mg | ORAL_TABLET | Freq: Once | ORAL | Status: AC
  Administered 2015-09-15: 50 mg via ORAL
  Filled 2015-09-15: qty 1

## 2015-09-15 MED ORDER — DILTIAZEM HCL ER COATED BEADS 240 MG PO CP24
240.0000 mg | ORAL_CAPSULE | Freq: Every day | ORAL | Status: DC
  Administered 2015-09-15 – 2015-09-16 (×2): 240 mg via ORAL
  Filled 2015-09-15 (×2): qty 1

## 2015-09-15 MED ORDER — SENNOSIDES-DOCUSATE SODIUM 8.6-50 MG PO TABS
2.0000 | ORAL_TABLET | Freq: Two times a day (BID) | ORAL | Status: DC
  Administered 2015-09-15 – 2015-09-16 (×2): 2 via ORAL
  Filled 2015-09-15 (×2): qty 2

## 2015-09-15 MED ORDER — IBUPROFEN 200 MG PO TABS
600.0000 mg | ORAL_TABLET | Freq: Four times a day (QID) | ORAL | Status: DC | PRN
  Administered 2015-09-15 – 2015-09-16 (×3): 600 mg via ORAL
  Filled 2015-09-15 (×3): qty 3

## 2015-09-15 MED ORDER — ACETAMINOPHEN 325 MG PO TABS
650.0000 mg | ORAL_TABLET | Freq: Once | ORAL | Status: AC
  Administered 2015-09-15: 650 mg via ORAL
  Filled 2015-09-15: qty 2

## 2015-09-15 MED ORDER — SODIUM CHLORIDE 0.9% FLUSH
3.0000 mL | Freq: Two times a day (BID) | INTRAVENOUS | Status: DC
  Administered 2015-09-15 – 2015-09-16 (×2): 3 mL via INTRAVENOUS

## 2015-09-15 MED ORDER — SODIUM CHLORIDE 0.9 % IV BOLUS (SEPSIS)
500.0000 mL | Freq: Once | INTRAVENOUS | Status: AC
  Administered 2015-09-15: 500 mL via INTRAVENOUS

## 2015-09-15 MED ORDER — FAMOTIDINE 20 MG PO TABS
20.0000 mg | ORAL_TABLET | Freq: Every day | ORAL | Status: DC
Start: 2015-09-15 — End: 2015-09-16
  Administered 2015-09-15 – 2015-09-16 (×2): 20 mg via ORAL
  Filled 2015-09-15 (×2): qty 1

## 2015-09-15 MED ORDER — PREGABALIN 75 MG PO CAPS: 75.0000 mg | ORAL_CAPSULE | Freq: Two times a day (BID) | ORAL | Status: DC

## 2015-09-15 MED ORDER — ENOXAPARIN SODIUM 40 MG/0.4ML ~~LOC~~ SOLN
40.0000 mg | SUBCUTANEOUS | Status: DC
  Administered 2015-09-15: 40 mg via SUBCUTANEOUS
  Filled 2015-09-15: qty 0.4

## 2015-09-15 MED ORDER — PREDNISONE 50 MG PO TABS
50.0000 mg | ORAL_TABLET | Freq: Once | ORAL | Status: DC
  Filled 2015-09-15: qty 1

## 2015-09-15 NOTE — Progress Notes (Signed)
On call paged to make aware that the patient has trouble swallowing and has allergies Lyrica.

## 2015-09-15 NOTE — H&P (Signed)
Date: 09/15/2015               Patient Name:  [name] MRN: [id number]  DOB: 04/12/1955 Age / Sex: 60 y.o., male   PCP: Ardyth Man, MD         Medical Service: Internal Medicine Teaching Service         Attending Physician: Dr. Tyson Alias, MD    First Contact: Dr. Dimple Casey Pager: [phone]  Second Contact: Dr. Danella Penton Pager: [phone]       After Hours (After 5p/  First Contact Pager: [phone]  weekends / holidays): Second Contact Pager: [phone]   Chief Complaint: Loss of consciousness  History of Present Illness: 60 y/o man with PMHx significant for HTN, GERD, multinodular goiter, and recent multilevel anterior cervical spinal fusion C2-C7 on 6/15 who presents to the ED via EMS after abrupt loss of consciousness. Since undergoing anterior cervical disc fusion on Thursday he had been taking oxycodone and flexeril afterwards for pain and lower back spasticity but felt increasingly drowsy on Sunday and Monday so he decreased the oxycodone and stopped taking flexeril. Then Tuesday morning he was at the bathroom sink this morning when he suddenly felt hot and diaphoretic and called for his wife. He then has no recollection of what his wife describes as him collapsing to the chair with jerking movements of both arms and both legs and lip smacking for about 30 seconds. Afterwards he was very diaphoretic and shaky but did not have any persistent drowsiness or focal deficits. He did not strike his head or fall to the ground. No incontinence of urine or stools. He was asymptomatic by time or arriving to the ED.  He also reports constipation having one difficult bowel movement since his surgery on Thursday. This has not improved much since he took senakot twice daily. He also feels like his skin is burning/tingling diffusely and he is hot most of the time. His arms feel at normal strength but he feels a fine tremor worsened on intentional movements. He also feels an increased urinary urge but no  urine is present when he tries to urinate.  Of note he was also hospitalized in November 2016 after acute weakness and diaphoresis that he said was described as syncope. Workup at that time demonstrated a bradyarrhythmia to the 40s and included a cardiac cath that demonstrated no obstructions. He has not had recurrent episodes since.   Meds: Current Facility-Administered Medications  Medication Dose Route Frequency Provider Last Rate Last Dose  . diltiazem (CARDIZEM CD) 24 hr capsule 240 mg  240 mg Oral Daily Fuller Plan, MD   240 mg at 09/15/15 1815  . enoxaparin (LOVENOX) injection 40 mg  40 mg Subcutaneous Q24H Fuller Plan, MD   40 mg at 09/15/15 1815  . famotidine (PEPCID) tablet 20 mg  20 mg Oral Daily Fuller Plan, MD   20 mg at 09/15/15 1815  . ibuprofen (ADVIL,MOTRIN) tablet 600 mg  600 mg Oral Q6H PRN Fuller Plan, MD   600 mg at 09/15/15 1815  . predniSONE (DELTASONE) tablet 50 mg  50 mg Oral Once Tyson Alias, MD      . senna-docusate (Senokot-S) tablet 2 tablet  2 tablet Oral BID Fuller Plan, MD      . sodium chloride flush (NS) 0.9 % injection 3 mL  3 mL Intravenous Q12H Fuller Plan, MD        Allergies: Allergies as of 09/15/2015 -  Review Complete 09/15/2015  Allergen Reaction Noted  . Enalapril Cough 01/29/2014  . Lyrica [pregabalin] Palpitations 09/15/2015  . Neurontin [gabapentin] Palpitations 11/02/2013   Past Medical History  Diagnosis Date  . Hypertension   . Reflux   . Spinal stenosis   . S/P cardiac cath 01/29/14     but with area of kinking obstructing the LCX to 50%. other wise patent coronary arteries.  . Sinus bradycardia 2015    Event monitor showed no critical bradycardia, tolerating low-dose beta blocker  . 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 His AHI was 0.4; however, his RDI was 8.2.  He had an increased arousals. There was no evidence for nocturnal myoclonus.   Past Surgical History  Procedure Laterality Date  . Cholecystectomy    . Left heart catheterization with coronary angiogram N/A 01/29/2014    Procedure: LEFT HEART CATHETERIZATION WITH CORONARY ANGIOGRAM;  Surgeon: Lyn Records III, MD; patent coronary arteries  but with area of kinking obstructing the LCX to 50%. EF nl       Family History  Problem Relation Age of Onset  . Heart failure Father   . Hypertension Father   . Lung cancer Sister   . Pancreatic cancer Sister   . Cancer Sister   . Hypertension Mother    Social History   Social History  . Marital Status: Married    Spouse Name: N/A  . Number of Children: N/A  . Years of Education: N/A   Occupational History  . Not on file.   Social History Main Topics  . Smoking status: Never Smoker   . Smokeless tobacco: Never Used  . Alcohol Use: No  . Drug Use: No  . Sexual Activity: Yes   Other Topics Concern  . Not on file   Social History Narrative    Review of Systems: Review of Systems  Constitutional: Positive for diaphoresis.  Eyes: Negative for blurred vision.  Respiratory: Negative for shortness of breath.   Cardiovascular: Negative for chest pain.  Gastrointestinal: Negative for abdominal pain.  Genitourinary: Positive for urgency and frequency. Negative for hematuria.  Musculoskeletal: Positive for back pain.  Skin: Negative for rash.  Neurological: Positive for dizziness, tingling, tremors, sensory change and loss of consciousness. Negative for speech change and headaches.  Endo/Heme/Allergies: Negative for polydipsia.  Psychiatric/Behavioral: The patient is not nervous/anxious and does not have insomnia.     Physical Exam: Blood pressure 140/81, pulse 74, temperature 98.3 F (36.8 C), temperature source Oral, resp. rate 18, height 6\' 1"  (1.854 m), weight 93.895 kg (207 lb), SpO2 99 %. GENERAL- alert, co-operative,  NAD HEENT- In a hard C-collar, clean well healing appearing incision on anterior neck, PERRLA, no oropharyngeal erythema CARDIAC- RRR, no murmurs, rubs or gallops. RESP- CTAB, no wheezes or crackles. ABDOMEN- Soft, nontender, no guarding or rebound, normoactive bowel sounds present BACK- Normal curvature, no paraspinal tenderness, no CVA tenderness. NEURO- Lower extremities 4/5 strength in knee extension, RUE 4/5 strength in flexion and extension, LUE 5/5, ROM grossly intact throughout, sensation to light touch intact throughout, 2+ ankle and knee jerk reflexes EXTREMITIES- pulse 2+, symmetric, no pedal edema SKIN- Warm, dry, No rash or lesion PSYCH- Normal mood and affect, appropriate thought content and speech  Lab results: Basic Metabolic Panel:  Recent Labs  16/10/96 0937  NA 139  K 3.9  CL 102  CO2 29  GLUCOSE 101*  BUN  8  CREATININE 1.16  CALCIUM 9.2   Liver Function Tests:  Recent Labs  09/15/15 0937  AST 108*  ALT 301*  ALKPHOS 110  BILITOT 0.9  PROT 6.3*  ALBUMIN 3.4*   No results for input(s): LIPASE, AMYLASE in the last 72 hours.  Recent Labs  09/15/15 1431  AMMONIA 26   CBC:  Recent Labs  09/15/15 0937  WBC 10.8*  NEUTROABS 7.7  HGB 13.6  HCT 42.6  MCV 90.3  PLT 212   Cardiac Enzymes:  Recent Labs  09/15/15 1602  CKTOTAL 61   Urine Drug Screen: Drugs of Abuse     Component Value Date/Time   LABOPIA NONE DETECTED 09/15/2015 1009   COCAINSCRNUR NONE DETECTED 09/15/2015 1009   LABBENZ NONE DETECTED 09/15/2015 1009   AMPHETMU NONE DETECTED 09/15/2015 1009   THCU NONE DETECTED 09/15/2015 1009   LABBARB NONE DETECTED 09/15/2015 1009    Alcohol Level:  Recent Labs  09/15/15 1009  ETH <5   Urinalysis:  Recent Labs  09/15/15 1009  COLORURINE YELLOW  LABSPEC 1.016  PHURINE 6.5  GLUCOSEU NEGATIVE  HGBUR NEGATIVE  BILIRUBINUR NEGATIVE  KETONESUR NEGATIVE  PROTEINUR NEGATIVE  NITRITE NEGATIVE  LEUKOCYTESUR NEGATIVE    Imaging results:  Ct Head Wo Contrast  09/15/2015  CLINICAL DATA:  Near syncope while using the restroom today. EXAM: CT HEAD WITHOUT CONTRAST TECHNIQUE: Contiguous axial images were obtained from the base of the skull through the vertex without intravenous contrast. COMPARISON:  None. FINDINGS: The brain appears normal without hemorrhage, infarct, mass lesion, mass effect, midline shift or abnormal extra-axial fluid collection. No hydrocephalus or pneumocephalus. The calvarium is intact. Imaged paranasal sinuses and mastoid air cells are clear. IMPRESSION: Negative head CT. Electronically Signed   By: Drusilla Kanner M.D.   On: 09/15/2015 10:56   Other results: EKG: normal EKG, normal sinus rhythm, unchanged from previous tracings, possible L atrial enlargement  Assessment & Plan by Problem: Syncope: Sudden loss of consciousness after prodrome of heat, dizziness, and diaphoresis in the bathroom this morning. He may have demonstrated some myoclonic jerks from this versus a true seizure. Cardiogenic syncope can be accompanied by convulsive movements, and also in vasovagal syncopal episodes. He also suffered no postictal period after a generalized loss of consciousness. His history of a somewhat similar episode in November is concerning for cardiogenic syncope. Lastly his recent surgery involving generalized sedation now with moderate postoperative neck swelling could be applying inflammation or pressure worsening autonomic dysregulation and neuropathy. He has no abnormalities on serum chemistry. His leukocytosis to 10.9 is very mild and was recently taking steroids at time of his hospitalization and surgery. He has no fever and vital signs are normal besides mild hypertension. -Telemetry observation overnight -Repeat AM EKG -Will attempt to contact his primary neurosurgeon's office for recommendations or coordination of care -Stop flexeril -Reduce oxycodone as tolerable for pain -Ambulate patient  for observation -check orthostatic vital signs -Repeat AM Bmet -Check CK  Hypertension: Mildly hypertensive on admission. He did not take his medications this morning. Due to possible contribution of bradyarrhythmia to his symptoms we will hold beta blocker initially. -Continue diltiazem  -Hold bystolic  Neck pain with radiculopathy: Local postoperative pain with associated swelling and odynophagia. He was taking a low total dose of opioids prior to admission due to feeling more drowsy than usual. -Prednisone  -Try APAP/NSAIDs for mild to moderate pain -Oxycodone  PRN  Transaminitis: AST 100s, ALT 300s. Other values normal T. Bili 0.9, protein  3.4, ammonia 26, CK 64. Baseline LFTs from last year are normal. Unclear significance of this and no abdominal pain or obvious s/s. It is possible he suffered transient significant hypotension from recent surgery or with syncopal episode. No known liver disease. S/p cholecystectomy 2009. -Repeat AM LFTs  GERD: On ranitidine 150mg  PTA. Stable problem -Pepcid 20mg   FULL CODE Diet: Soft VTE ppx: Bethany enoxaparin  Dispo: Disposition is deferred at this time, awaiting improvement of current medical problems. Anticipated discharge in approximately 1-2 day(s).   The patient does have a current PCP Ardyth Man(Chan M Park, MD) and [facility] after discharge.  The patient does not have transportation limitations that hinder transportation to clinic appointments. Signed: Fuller Planhristopher W Jaisean Monteforte, MD 09/15/2015, 6:52 PM

## 2015-09-15 NOTE — ED Notes (Signed)
Pt arrives from home via [facility]. Pt had a witnessed event this morning with tonic clonic movements for about 30 sec and a LOC for about 15 sec per wife. [facility] Thursday rt coordination issues per pt. Pt wife states the pt was foggy and lethargic yesterday, pt is taking percocet and a muscle relaxer for post op pain. Pt has no oral trauma, incontinence and had no period of being post ictal. Pt is noted to have had a syncopal episode in November with no known cause. Pt also states he has been feeling the urge to urinate without having much flow.

## 2015-09-15 NOTE — ED Notes (Signed)
Pt complaining of feeling hot and faint, like he did before his seizure earlier today. EKG repeated and given to Dr. Estell HarpinZammit and Morrie SheldonAshley, PA.

## 2015-09-15 NOTE — Progress Notes (Signed)
.  Pt arrived to 5M11  @ current 1642, Pt A&Ox 4, c/o pain 0/10. Dressing CDI, no drains. Pt VS taken,oley intact, unclamped. Pt without distress. Family at the bedside.

## 2015-09-15 NOTE — ED Provider Notes (Signed)
CSN: [id number]     Arrival date & time 09/15/15  4098 History   First MD Initiated Contact with Patient 09/15/15 [phone]     Chief Complaint  Patient presents with  . Seizures     (Consider location/radiation/quality/duration/timing/severity/associated sxs/prior Treatment) HPI Comments: [name] is a 60 y.o. male with history of HTN, GERD, s/p cardiac cath in 2015, and s/p multilevel cervical spinal fusion 09/10/15 presents to ED via EMS following LOC and suspected seizure activity. Patient is s/p multilevel cervical spinal fusion on 09/10/15 completed at [facility] by Dr. Fredda Hammed. He has been taking oxycodone and flexeril for pain control. Per wife, patient was having action tremors in hands on Saturday and Sunday; activities he was able to perform prior to surgery such as putting toothpaste on toothbrush and pouring a drink he was having difficulty. On Monday, wife states patient was lethargic and "foggy" - he was asking the same question repeatedly, e.g. What day it was and where was his son. Pt endorses being sensitive to medications. Wife scaled back medications yesterday. Today, patient was standing at the sink when he started to feel diaphoretic and dizzy. He called for his wife and sat down on a chair. Per wife, patient passed out and had full body convulsions for approximately 20-30 seconds with eyes rolling back into head and mouth off to right side. No incontinence. No tongue biting. When patient regained consciousness he was unaware that he passed out. Patient is currently asx.   Of note, patient also endorses constipation. Last BM was 09/10/15. He has been taking senakot 2 x a day with no relief. He also has some difficulty urinating - he feels the urge to urinate; however, does feel he is able   Patient is a 60 y.o. male presenting with seizures. The history is provided by the patient and the spouse.  Seizures   Past Medical History  Diagnosis Date  . Hypertension   . Reflux   . Spinal  stenosis   . S/P cardiac cath 01/29/14     but with area of kinking obstructing the LCX to 50%. other wise patent coronary arteries.  . Sinus bradycardia 2015    Event monitor showed no critical bradycardia, tolerating low-dose beta blocker  . 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 His AHI was 0.4; however, his RDI was 8.2. He had an increased arousals. There was no evidence for nocturnal myoclonus.   Past Surgical History  Procedure Laterality Date  . Cholecystectomy    . Left heart catheterization with coronary angiogram N/A 01/29/2014    Procedure: LEFT HEART CATHETERIZATION WITH CORONARY ANGIOGRAM;  Surgeon: Lyn Records III, MD; patent coronary arteries  but with area of kinking obstructing the LCX to 50%. EF nl       Family History  Problem Relation Age of Onset  . Heart failure Father   . Hypertension Father   . Lung cancer Sister   . Pancreatic cancer Sister   . Cancer Sister   . Hypertension Mother    Social History  Substance Use Topics  . Smoking status: Never Smoker   . Smokeless tobacco: Never Used  . Alcohol Use: No    Review of Systems  Constitutional: Positive for diaphoresis ( prior to LOC, currently resolved).  HENT: Positive for sore throat ( s/p cervical spinal fusion).   Gastrointestinal: Positive for constipation (last BM 09/10/15).  Genitourinary: Positive for  difficulty urinating (urge to urinate; however, difficulty).  Musculoskeletal: Positive for neck pain ( s/p cervical spinal fusion 09/10/15).  Skin: Positive for wound ( s/p cervical spinal fusion).  Neurological: Positive for dizziness ( prior to LOC, currently resolved), seizures and syncope.  All other systems reviewed and are negative.     Allergies  Enalapril; Lyrica; and Neurontin  Home Medications   Prior to Admission medications   Medication Sig Start Date End  Date Taking? Authorizing Provider  cyclobenzaprine (FLEXERIL) 5 MG tablet Take 5-10 mg by mouth every 8 (eight) hours as needed. Muscle spasms 09/11/15 10/26/15 Yes Historical Provider, MD  diltiazem (CARDIZEM CD) 240 MG 24 hr capsule Take 240 mg by mouth daily.   Yes Historical Provider, MD  nebivolol (BYSTOLIC) 5 MG tablet Take 1 tablet (5 mg total) by mouth daily. 08/03/15  Yes Lennette Bihari, MD  oxyCODONE (OXY IR/ROXICODONE) 5 MG immediate release tablet Take 5 mg by mouth every 4 (four) hours as needed. pain 09/11/15 10/26/15 Yes Historical Provider, MD  ranitidine (ZANTAC) 150 MG tablet TAKE 1 TABLET (150 MG TOTAL) BY MOUTH 2 (TWO) TIMES DAILY. 07/31/15  Yes Lennette Bihari, MD  senna-docusate (SENOKOT-S) 8.6-50 MG tablet Take 2 tablets by mouth 2 (two) times daily. 09/11/15 10/11/15 Yes Historical Provider, MD  spironolactone (ALDACTONE) 25 MG tablet TAKE 1 TABLET (25 MG TOTAL) BY MOUTH DAILY. 07/31/15  Yes Lennette Bihari, MD   BP 146/91 mmHg  Pulse 75  Temp(Src) 98.1 F (36.7 C) (Oral)  Resp 20  Ht 6\' 1"  (1.854 m)  Wt 93.895 kg  BMI 27.32 kg/m2  SpO2 98% Physical Exam  Constitutional: He appears well-developed and well-nourished. No distress.  HENT:  Head: Normocephalic and atraumatic.  Mouth/Throat: Uvula is midline, oropharynx is clear and moist and mucous membranes are normal. No trismus in the jaw. No uvula swelling. No oropharyngeal exudate.  Eyes: Conjunctivae and EOM are normal. Pupils are equal, round, and reactive to light. Right eye exhibits no discharge. Left eye exhibits no discharge. No scleral icterus.  Neck: Phonation normal.  Unable to assess neck ROM secondary to cervical collar s/p cervical spinal fusion surgery last week.   Cardiovascular: Normal rate, regular rhythm, normal heart sounds and intact distal pulses.   No murmur heard. Pulmonary/Chest: Effort normal and breath sounds normal. No respiratory distress.  Abdominal: Soft. Normal appearance and bowel sounds are  normal. He exhibits no pulsatile midline mass. There is no tenderness. There is no rebound and no guarding.  Musculoskeletal: Normal range of motion.  Neurological: He is alert. Coordination normal.  Mental Status:  Alert, oriented, thought content appropriate. Speech fluent without evidence of aphasia. Able to follow 2 step commands without difficulty.  Cranial Nerves:  II:  Peripheral visual fields grossly normal, pupils equal, round, reactive to light III,IV, VI: ptosis not present, extra-ocular motions intact bilaterally  V,VII: smile symmetric, facial light touch sensation equal VIII: hearing grossly normal to voice  X: uvula elevates symmetrically  XI: bilateral shoulder shrug symmetric and strong XII: midline tongue extension without fassiculations Motor:  Normal tone. 5/5 in upper and lower extremities bilaterally including strong and equal grip strength and dorsiflexion/plantar flexion Sensory: light touch normal in all extremities.  Deep Tendon Reflexes: 2+ and symmetric in the biceps and patella Cerebellar: normal finger-to-nose with bilateral upper extremities Gait: normal gait and balance CV: distal pulses palpable throughout     Skin: Skin is warm and dry. He is not diaphoretic.  Psychiatric: He is withdrawn.    ED Course  Procedures (including critical care time) Labs Review Labs Reviewed  CBC WITH DIFFERENTIAL/PLATELET - Abnormal; Notable for the following:    WBC 10.8 (*)    All other components within normal limits  COMPREHENSIVE METABOLIC PANEL - Abnormal; Notable for the following:    Glucose, Bld 101 (*)    Total Protein 6.3 (*)    Albumin 3.4 (*)    AST 108 (*)    ALT 301 (*)    All other components within normal limits  CBC - Abnormal; Notable for the following:    WBC 12.0 (*)    All other components within normal limits  TSH - Abnormal; Notable for the following:    TSH 0.348 (*)    All other components within normal limits  URINALYSIS,  ROUTINE W REFLEX MICROSCOPIC (NOT AT [facility])  ETHANOL  URINE RAPID DRUG SCREEN, HOSP PERFORMED  AMMONIA  CK  COMPREHENSIVE METABOLIC PANEL  HIV ANTIBODY (ROUTINE TESTING)  I-STAT TROPOININ, ED    Imaging Review Ct Head Wo Contrast  09/15/2015  CLINICAL DATA:  Near syncope while using the restroom today. EXAM: CT HEAD WITHOUT CONTRAST TECHNIQUE: Contiguous axial images were obtained from the base of the skull through the vertex without intravenous contrast. COMPARISON:  None. FINDINGS: The brain appears normal without hemorrhage, infarct, mass lesion, mass effect, midline shift or abnormal extra-axial fluid collection. No hydrocephalus or pneumocephalus. The calvarium is intact. Imaged paranasal sinuses and mastoid air cells are clear. IMPRESSION: Negative head CT. Electronically Signed   By: Drusilla Kannerhomas  Dalessio M.D.   On: 09/15/2015 10:56   I have personally reviewed and evaluated these images and lab results as part of my medical decision-making.   EKG Interpretation   Date/Time:  Tuesday September 15 2015 09:43:32 EDT Ventricular Rate:  65 PR Interval:    QRS Duration: 103 QT Interval:  392 QTC Calculation: 408 R Axis:   38 Text Interpretation:  Sinus rhythm Probable left atrial enlargement  Confirmed by ZAMMIT  MD, JOSEPH [phone]([id number]) on 09/15/2015 1:53:24 PM      MDM   Final diagnoses:  Syncope, unspecified syncope type  Elevated LFTs  Altered mental status, unspecified altered mental status type   Patient is afebrile and non-toxic appearing. His vital signs are stable. Patient is currently asx. He appears slightly withdrawn. Physical exam re-assuring. Tylenol given for pain control. Concern for possible ?seizure. Negative head CT. Electrolytes unremarkable. U/A, UDS, and ETOH re-assuring. EKG shows NSR, troponin negative. Mild elevation of WBC, may be secondary to recent surgery. Elevated LFTs noted, ammonia normal. Consult to neurology for further evaluation of possible seizures.    12:24 PM: Dr. Amada JupiterKirkpatrick of neurology consulted. I appreciated his input. 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 Encourage outpatient follow up with neurology.   Patient endorses improvement in neck pain from 6/10 to 3/10 following Tylenol. Patient also able to have a bowel movement. Wife continues to endorse patient not at baseline mentation; patient also endorses lethargy and "foggy" feeling. Regarding syncopal episode, electrolytes normal - doubt metabolic derangement. Pt. Not orthostatic. ?vaso-vagal vs. ?cardiogenic. Review of records, show patient has had similar episodes in the past, in many cases appear to be associated with micturation; however, in Sept. 2016 patient was sxs with HR documented in 20's. Will consult to hospitalist for admission for observation following syncopal episode.   Internal medicine service consulted, I appreciated their input. Will admit to telemetry for further observation following syncopal episode and altered  mental status.   [address] [place], [place] 09/16/15 1610  Bethann Berkshire, MD 09/16/15 [phone]

## 2015-09-16 DIAGNOSIS — R55 Syncope and collapse: Secondary | ICD-10-CM | POA: Diagnosis not present

## 2015-09-16 DIAGNOSIS — R4182 Altered mental status, unspecified: Secondary | ICD-10-CM

## 2015-09-16 LAB — T4, FREE: FREE T4: 1.06 ng/dL (ref 0.61–1.12)

## 2015-09-16 LAB — COMPREHENSIVE METABOLIC PANEL
ALT: 230 U/L — ABNORMAL HIGH (ref 17–63)
ANION GAP: 8 (ref 5–15)
AST: 42 U/L — ABNORMAL HIGH (ref 15–41)
Albumin: 3.4 g/dL — ABNORMAL LOW (ref 3.5–5.0)
Alkaline Phosphatase: 116 U/L (ref 38–126)
BUN: 12 mg/dL (ref 6–20)
CHLORIDE: 99 mmol/L — AB (ref 101–111)
CO2: 28 mmol/L (ref 22–32)
Calcium: 9.5 mg/dL (ref 8.9–10.3)
Creatinine, Ser: 0.99 mg/dL (ref 0.61–1.24)
GFR calc non Af Amer: >60 mL/min (ref >60)
Glucose, Bld: 159 mg/dL — ABNORMAL HIGH (ref 65–99)
POTASSIUM: 4.1 mmol/L (ref 3.5–5.1)
SODIUM: 135 mmol/L (ref 135–145)
Total Bilirubin: 0.8 mg/dL (ref 0.3–1.2)
Total Protein: 7 g/dL (ref 6.5–8.1)

## 2015-09-16 LAB — HIV ANTIBODY (ROUTINE TESTING W REFLEX): HIV Screen 4th Generation wRfx: NONREACTIVE

## 2015-09-16 MED ORDER — TRAMADOL HCL 50 MG PO TABS
50.0000 mg | ORAL_TABLET | Freq: Four times a day (QID) | ORAL | Status: DC | PRN
  Administered 2015-09-16: 50 mg via ORAL
  Filled 2015-09-16: qty 1

## 2015-09-16 MED ORDER — ONDANSETRON HCL 4 MG PO TABS: 4.0000 mg | ORAL_TABLET | Freq: Three times a day (TID) | ORAL | Status: DC | PRN

## 2015-09-16 MED ORDER — ACETAMINOPHEN 325 MG PO TABS
650.0000 mg | ORAL_TABLET | Freq: Once | ORAL | Status: AC
  Administered 2015-09-16: 650 mg via ORAL
  Filled 2015-09-16: qty 2

## 2015-09-16 MED ORDER — TRAMADOL HCL 50 MG PO TABS: 50.0000 mg | ORAL_TABLET | Freq: Four times a day (QID) | ORAL | Status: DC | PRN

## 2015-09-16 NOTE — Discharge Summary (Signed)
Name: Jason SparrowRobert West MRN: [id number] DOB: 03/03/1956 60 y.o. PCP: [name], MD  Date of Admission: 09/15/2015  9:31 AM Date of Discharge: 09/16/2015 Attending Physician: [name], MD  Discharge Diagnosis: Active Problems:   Altered mental status   Syncopal episodes  Discharge Medications:   Medication List    STOP taking these medications        nebivolol 5 MG tablet  Commonly known as:  BYSTOLIC     oxyCODONE 5 MG immediate release tablet  Commonly known as:  Oxy IR/ROXICODONE      TAKE these medications        cyclobenzaprine 5 MG tablet  Commonly known as:  FLEXERIL  Take 5-10 mg by mouth every 8 (eight) hours as needed. Muscle spasms     diltiazem 240 MG 24 hr capsule  Commonly known as:  CARDIZEM CD  Take 240 mg by mouth daily.     ranitidine 150 MG tablet  Commonly known as:  ZANTAC  TAKE 1 TABLET (150 MG TOTAL) BY MOUTH 2 (TWO) TIMES DAILY.     senna-docusate 8.6-50 MG tablet  Commonly known as:  Senokot-S  Take 2 tablets by mouth 2 (two) times daily.     spironolactone 25 MG tablet  Commonly known as:  ALDACTONE  TAKE 1 TABLET (25 MG TOTAL) BY MOUTH DAILY.     traMADol 50 MG tablet  Commonly known as:  ULTRAM  Take 1 tablet (50 mg total) by mouth every 6 (six) hours as needed for moderate pain or severe pain.        Disposition and follow-up:   [name].  At the hospital follow up visit please address:  1.  Situational Syncope: Characteristic history with prodrome shortly after micturation. His recent anterior neck surgery may have predisposed him to some abnormal autonomic signalling. Assess for repeat events and if presentation changes may need additional workup as indicated.  2.  Hypertension: Bystolic 5mg  was discontinued during this admission to help decrease risk of recurrent syncope. Consider stopping diltiazem as well if syncope repeats. Medications may  be appropriate to restart for blood pressure control if syncope is controlled.  Follow-up Appointments: Follow-up Information    Call Ardyth ManPARK,CHAN M, MD.   Specialty:  Family Medicine   Why:  As needed   Contact information:   [address] [place] [id number] [phone]      Discharge Instructions:  Consultations:    Procedures Performed:  Ct Head Wo Contrast  09/15/2015  CLINICAL DATA:  Near syncope while using the restroom today. EXAM: CT HEAD WITHOUT CONTRAST TECHNIQUE: Contiguous axial images were obtained from the base of the skull through the vertex without intravenous contrast. COMPARISON:  None. FINDINGS: The brain appears normal without hemorrhage, infarct, mass lesion, mass effect, midline shift or abnormal extra-axial fluid collection. No hydrocephalus or pneumocephalus. The calvarium is intact. Imaged paranasal sinuses and mastoid air cells are clear. IMPRESSION: Negative head CT. Electronically Signed   By: Drusilla Kannerhomas  Dalessio M.D.   On: 09/15/2015 10:56   Nm Thyroid Sng Uptake W/imaging  09/01/2015  CLINICAL DATA:  Multiple thyroid nodules, subclinical hyperthyroidism, TSH = 0.15 ; symptoms of weight loss, sleep assess, epistaxis, hoarseness, temperature and sensitivity, hot flashes EXAM: THYROID SCAN AND UPTAKE - 24 HOURS TECHNIQUE: Following the per oral administration of I-131 sodium iodide, the patient returned at 24 hours and uptake measurements were acquired with the uptake probe centered  on the neck. Thyroid imaging was performed following the intravenous administration of the Tc-50m Pertechnetate. RADIOPHARMACEUTICALS:  14 MicroCuries I-131 sodium iodide orally and 10 mCi Technetium-77m pertechnetate IV COMPARISON:  Thyroid ultrasound biopsy 08/12/2015, thyroid ultrasound 07/09/2015 FINDINGS: 24 hour radio iodine uptake calculated at 16%, within the normal range. Images of the thyroid gland in multiple projections demonstrate warm nodules at the upper and lower poles of  the at RIGHT thyroid lobe and NI cold nodule at the inferior pole the LEFT lobe. No definite corresponding mass is seen in the LEFT lobe on sonography. The solid and cystic nodule identified in the mid to inferior RIGHT thyroid lobe on the prior ultrasound grossly corresponds in position with an area of increased tracer accumulation on scintigraphy suggesting adenoma. IMPRESSION: Normal 24 hour radio iodine uptake of 16%. Warm nodules at the upper and mid portions of the RIGHT thyroid lobe, with the nodule at the inferior pole grossly corresponding in position with the solid and cystic nodule identified by previous thyroid ultrasound question thyroid adenoma. Area of decreased tracer accumulation at the inferior pole of the LEFT thyroid lobe question cold nodule though no accompanying LEFT lobe nodule is identified at this site by prior ultrasound. Electronically Signed   By: [name]   On: 09/01/2015 14:44   Admission HPI: 60 y/o man with PMHx significant for HTN, GERD, multinodular goiter, and recent multilevel anterior cervical spinal fusion C2-C7 on 6/15 who presents to the ED via EMS after abrupt loss of consciousness. Since undergoing anterior cervical disc fusion on Thursday he had been taking oxycodone and flexeril afterwards for pain and lower back spasticity but felt increasingly drowsy on Sunday and Monday so he decreased the oxycodone and stopped taking flexeril. Then Tuesday morning he was at the bathroom sink this morning when he suddenly felt hot and diaphoretic and called for his wife. He then has no recollection of what his wife describes as him collapsing to the chair with jerking movements of both arms and both legs and lip smacking for about 30 seconds. Afterwards he was very diaphoretic and shaky but did not have any persistent drowsiness or focal deficits. He did not strike his head or fall to the ground. No incontinence of urine or stools. He was asymptomatic by time or arriving to  the ED.  He also reports constipation having one difficult bowel movement since his surgery on Thursday. This has not improved much since he took senakot twice daily. He also feels like his skin is burning/tingling diffusely and he is hot most of the time. His arms feel at normal strength but he feels a fine tremor worsened on intentional movements. He also feels an increased urinary urge but no urine is present when he tries to urinate.  Of note he was also hospitalized in November 2016 after acute weakness and diaphoresis that he said was described as syncope. Workup at that time demonstrated a bradyarrhythmia to the 40s and included a cardiac cath that demonstrated no obstructions. He has not had recurrent episodes since.  Hospital Course by problem list: Syncopal episode: Patient was monitored on telemetry overnight with no significant arrhythmia recorded. He continued to have a generalized sensation of warmth but did not experience diaphoresis or lightheadedness while in observation. Metabolic panel remained normal on repeat. Orthostatic vital signs were negative. Symptoms most consistent with neurocardiogenic syncope and bystolic  was held as possible contributor. Diltiazem  was continued during the admission.  Neck pain with radiculopathy: He continued  to have a fair degree of pain and particularly lower back spasms. His oxycodone was held because he attributes a "mental fog" feeling to this and was started on low dose tramadol as an alternative. He also had a fair amount of neck pain worsened with swallowing so was given 40mg  prednisone once with a good improvement in his symptoms.  Subclinical hyperthyroidism: TSH was checked low at 0.328 but Free T4 was normal when checked.  GERD: Home ranitidine 150mg  temporarily replaced with pepcid 20mg  during hospital course.  Discharge Vitals:   BP 123/72 mmHg  Pulse 74  Temp(Src) 98.1 F (36.7 C) (Oral)  Resp 16  Ht 6\' 1"  (1.854 m)  Wt  93.895 kg (207 lb)  BMI 27.32 kg/m2  SpO2 99%  Discharge Labs:  No results found for this or any previous visit (from the past 24 hour(s)).  Signed: Fuller Plan, MD 09/18/2015, 10:22 AM

## 2015-09-16 NOTE — Discharge Instructions (Signed)
You have a risk of recurrent syncope so we recommend stopping your Bystolic as this type of medication can increase the risk of a low heart rate or blood pressure worsened by your recent surgery and neck inflammation.  Also tramadol may be a pain medication that is better tolerated than oxycodone. This medication is prescribed for #30 pills to take up to every 6 hours as needed. I recommend contacting your primary physician or surgeon's office again if you need additional treatments beyond this and the medication works better for you.

## 2015-09-16 NOTE — Progress Notes (Signed)
Pharmacist Student Provided - Patient Medication Education Education Best boyof B1 [employer] Team Teaching Service Patient  Education: Educated the patient on some of current medications, specifically enoxaparin and tramadol. Reviewed side effects with the patient. Answered all of the patient's medication specific questions. These medications are on the patient's current med list and are subject to change upon discharge.  Delmar LandauJonathan Mabrey Howland P4 PharmD Candidate

## 2015-09-16 NOTE — Progress Notes (Signed)
Patient has been complaining of intermittent nausea, diaphoresis, anxiety, and "tingling" all over his body since prior to admission. Patient denies having any chest pain, shortness of breath, or palpitations. Wife states patient's symptoms initially started a few days ago after his PCA pump was stopped after surgery. Patient denies having any pain at present. Wife also mentions patient was recently diagnosed with hyperthyroidism but was not started on any medications because "his lab numbers were not high enough."  Vitals: Temperature 98.1 F, pulse 75, respiratory rate 20, blood pressure 146/91, SPO2 98% on room air  Physical exam: Heart: RRR. S1 and S2 appreciated. No M/R/G. Lungs: CTAB. No wheezes, rales, or rhonchi. He is breathing comfortably on room air. Abdomen: Soft, nontender, nondistended, no guarding. Neuro: AAO 3. Cranial nerves II through XII grossly intact. Coordination normal. Strength 5 out of 5 and sensation grossly intact in bilateral upper and lower extremities.  A/P: Patient's symptoms could possibly be neurogenic. Anxiety could also possibly be playing a role.  Vitals are stable - he is not hypotensive or tachycardic. SPO2 98% on room air. Neuro exam is non-focal and patient is AAO x3. CBC was checked and is showing a mildly elevated white count of 12 likely in the setting of steroid use (currently on Prednisone). He is afebrile, as such, suspicion for infection as a source of his symptoms is low. TSH was checked and is borderline low at 0.348 (normal reference range is 0.350-4.500).  -Zofran 4 mg q8 prn nausea -Will hold off giving a benzodiazepine for anxiety at this time as patient presented to the hospital with AMS. -CTM

## 2015-09-16 NOTE — Progress Notes (Signed)
Subjective: Patient had some throat pain with swallowing last night that is partially improved after taking prednisone and tylenol. He continued to feel very hot but temperature remained normal.  Objective: Vital signs in last 24 hours: Filed Vitals:   09/15/15 2153 09/16/15 0159 09/16/15 0556 09/16/15 1024  BP: 146/91 135/83 136/86 123/72  Pulse: 75 74 73 74  Temp: 98.1 F (36.7 C) 97.4 F (36.3 C) 97.8 F (36.6 C) 98.1 F (36.7 C)  TempSrc: Oral Oral Oral Oral  Resp: Height:      Weight:      SpO2: 98% 98% 100% 99%   Weight change:   Intake/Output Summary (Last 24 hours) at 09/16/15 1311 Last data filed at 09/16/15 0023  Gross per 24 hour  Intake      0 ml  Output    550 ml  Net   -550 ml   GENERAL- alert, co-operative, NAD HEENT- In hard C collar, left anterior cervical incision is c/d/i with mild surrounding swelling. CARDIAC- RRR, 2/6 systolic murmur auscultated at LUSB RESP- CTAB, no wheezes or crackles. EXTREMITIES- symmetric, no pedal edema. SKIN- Warm, dry, No rash or lesion. PSYCH- Normal mood and affect, appropriate thought content and speech.  Lab Results: Basic Metabolic Panel:  Recent Labs Lab 09/15/15 0937 09/16/15 0558  NA 139 135  K 3.9 4.1  CL 102 99*  CO2 29 28  GLUCOSE 101* 159*  BUN 8 12  CREATININE 1.16 0.99  CALCIUM 9.2 9.5   Liver Function Tests:  Recent Labs Lab 09/15/15 0937 09/16/15 0558  AST 108* 42*  ALT 301* 230*  ALKPHOS 110 116  BILITOT 0.9 0.8  PROT 6.3* 7.0  ALBUMIN 3.4* 3.4*   No results for input(s): LIPASE, AMYLASE in the last 168 hours.  Recent Labs Lab 09/15/15 1431  AMMONIA 26   CBC:  Recent Labs Lab 09/15/15 0937 09/15/15 2153  WBC 10.8* 12.0*  NEUTROABS 7.7  --   HGB 13.6 13.8  HCT 42.6 42.4  MCV 90.3 87.8  PLT 212 223   Cardiac Enzymes:  Recent Labs Lab 09/15/15 1602  CKTOTAL 61   Thyroid Function Tests:  Recent Labs Lab 09/15/15 2145 09/15/15 2153  TSH  --   0.348*  FREET4 1.06  --    Alcohol Level:  Recent Labs Lab 09/15/15 1009  ETH <5   Urinalysis:  Recent Labs Lab 09/15/15 1009  COLORURINE YELLOW  LABSPEC 1.016  PHURINE 6.5  GLUCOSEU NEGATIVE  HGBUR NEGATIVE  BILIRUBINUR NEGATIVE  KETONESUR NEGATIVE  PROTEINUR NEGATIVE  NITRITE NEGATIVE  LEUKOCYTESUR NEGATIVE   Micro Results: No results found for this or any previous visit (from the past 240 hour(s)). Studies/Results: Ct Head Wo Contrast  09/15/2015  CLINICAL DATA:  Near syncope while using the restroom today. EXAM: CT HEAD WITHOUT CONTRAST TECHNIQUE: Contiguous axial images were obtained from the base of the skull through the vertex without intravenous contrast. COMPARISON:  None. FINDINGS: The brain appears normal without hemorrhage, infarct, mass lesion, mass effect, midline shift or abnormal extra-axial fluid collection. No hydrocephalus or pneumocephalus. The calvarium is intact. Imaged paranasal sinuses and mastoid air cells are clear. IMPRESSION: Negative head CT. Electronically Signed   By: Drusilla Kanner M.D.   On: 09/15/2015 10:56   Medications: I have reviewed the patient's current medications. Scheduled Meds: . diltiazem  240 mg Oral Daily  . enoxaparin (LOVENOX) injection  40 mg Subcutaneous Q24H  . famotidine  20 mg Oral Daily  .  predniSONE  50 mg Oral Once  . senna-docusate  2 tablet Oral BID  . sodium chloride flush  3 mL Intravenous Q12H   Continuous Infusions:  PRN Meds:.ibuprofen, ondansetron, traMADol Assessment/Plan: Syncope: No cardiac abnormalities on telemetry or EKG. Repeat metabolic panel remains normal. He did not have orthostatic hypotension or bradycardia here. Given his prodromal symptoms this is very characteristic of a situational/neurocardiogenic syncope rather than a primary cardiac cause. Suspect possible exacerbation of some autonomic dysfunction after recent neck surgery with soft tissue swelling and fixed in place with C collar. -We  will discharge to home today -Recommend seated or supine position if prodromal symptoms recur  Hypertension: Blood pressure normal on his diltiazem 240mg . Will recommend continuing to hold bystolic at least in the short term as beta blocker will increase risk of recurrent situational syncope. He has not been orthostatic and we will continue diltiazem but this may also be appropriate to hold if his symptoms recur. -Continue diltiazem 240mg   Neck pain with radiculopathy: Feeling better after a dose of prednisone. He is interested in something besides oxycodone due to pruritic sensation and drowsiness with this medicine so we may try a low dose tramadol. -Tramadol 50mg  q6hrs PRN  Transaminitis: Trending downward since yesterday, partially improved. Likely related to recent surgery. I would recommend a F/U with PCP in the future to confirm normalization but no intervention at this time.  FULL CODE Diet: Soft VTE ppx: [place] enoxaparin  Dispo:  Anticipated discharge to home later today.   LOS: 1 day   Fuller Planhristopher W Dare Sanger, MD 09/16/2015, 1:11 PM

## 2015-09-16 NOTE — Progress Notes (Signed)
Patient is discharged from room 5M11 at this time. Alert and in stable condition. IV site d/c'd as well as tele. Instructions read to patient with understanding verbalized. Left unit via wheelchair with all belongings and wife at side.

## 2015-10-14 ENCOUNTER — Other Ambulatory Visit (HOSPITAL_COMMUNITY): Payer: Self-pay | Admitting: Family Medicine

## 2015-10-14 DIAGNOSIS — R109 Unspecified abdominal pain: Secondary | ICD-10-CM

## 2015-10-16 ENCOUNTER — Ambulatory Visit (HOSPITAL_COMMUNITY): Payer: BLUE CROSS/BLUE SHIELD

## 2015-10-19 ENCOUNTER — Ambulatory Visit (HOSPITAL_COMMUNITY)
Admission: RE | Admit: 2015-10-19 | Discharge: 2015-10-19 | Disposition: A | Discharge status: Home / Self Care | Payer: BLUE CROSS/BLUE SHIELD | Source: Ambulatory Visit | Attending: Family Medicine | Admitting: Family Medicine

## 2015-10-19 DIAGNOSIS — R109 Unspecified abdominal pain: Secondary | ICD-10-CM | POA: Insufficient documentation

## 2015-10-19 DIAGNOSIS — R932 Abnormal findings on diagnostic imaging of liver and biliary tract: Secondary | ICD-10-CM | POA: Diagnosis not present

## 2015-10-19 DIAGNOSIS — Z9049 Acquired absence of other specified parts of digestive tract: Secondary | ICD-10-CM | POA: Insufficient documentation

## 2015-10-19 DIAGNOSIS — K838 Other specified diseases of biliary tract: Secondary | ICD-10-CM | POA: Insufficient documentation

## 2015-10-19 DIAGNOSIS — K7689 Other specified diseases of liver: Secondary | ICD-10-CM | POA: Diagnosis not present

## 2015-11-06 ENCOUNTER — Other Ambulatory Visit: Payer: Self-pay | Admitting: Cardiovascular Disease

## 2016-02-17 ENCOUNTER — Other Ambulatory Visit: Payer: Self-pay | Admitting: Cardiovascular Disease

## 2016-03-23 ENCOUNTER — Other Ambulatory Visit: Payer: Self-pay | Admitting: *Deleted

## 2016-03-23 ENCOUNTER — Other Ambulatory Visit: Payer: Self-pay | Admitting: Cardiovascular Disease

## 2016-03-23 MED ORDER — SPIRONOLACTONE 25 MG PO TABS: 25.0000 mg | ORAL_TABLET | Freq: Every day | ORAL | 0 refills | Status: DC

## 2016-04-20 ENCOUNTER — Other Ambulatory Visit: Payer: Self-pay | Admitting: Cardiovascular Disease

## 2016-04-30 IMAGING — CT CT ANGIO CHEST
2 of 9 series · 17 of 46 positions shown · IV contrast (OMNI)
Comparison: CTA chest 04/16/2007.

CLINICAL DATA: Resting chest pain. Bradycardia with near syncope.
Long-standing history of hypertension. Cardiac catheterization
yesterday demonstrated an approximate 50% left circumflex coronary
stenosis due to an abrupt angulation in the vessel.

EXAM:
CT ANGIOGRAPHY CHEST WITH CONTRAST
TECHNIQUE: Multidetector CT imaging of the chest was performed using the
standard protocol during bolus administration of intravenous
contrast. Multiplanar CT image reconstructions and MIPs were
obtained to evaluate the vascular anatomy.
CONTRAST:  80mL OMNIPAQUE IOHEXOL 350 MG/ML IV.

[Series 5: thins · axial · 0.70mm/px · z∈[-300,-62]mm · 14 of 266 slices shown]
[im 14/266  lung]
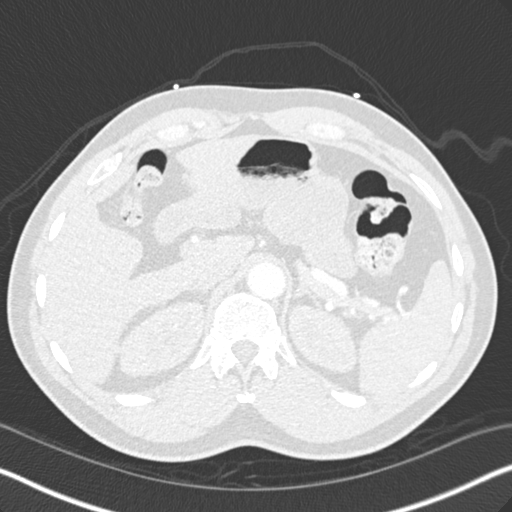
[im 28/266  soft-tissue]
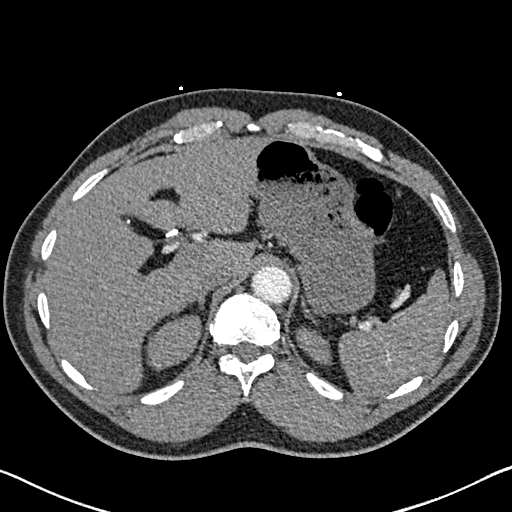
[im 56/266  lung]
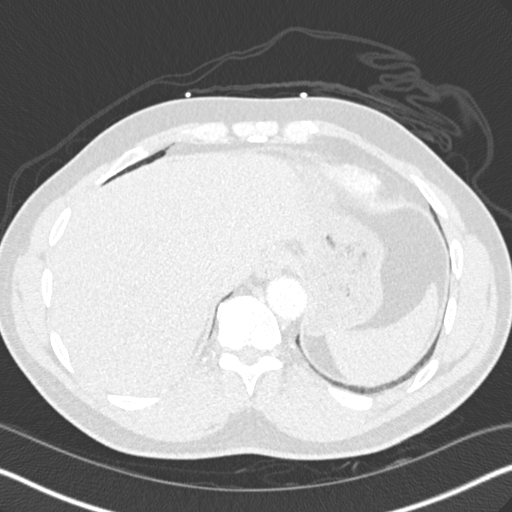
[im 70/266  soft-tissue]
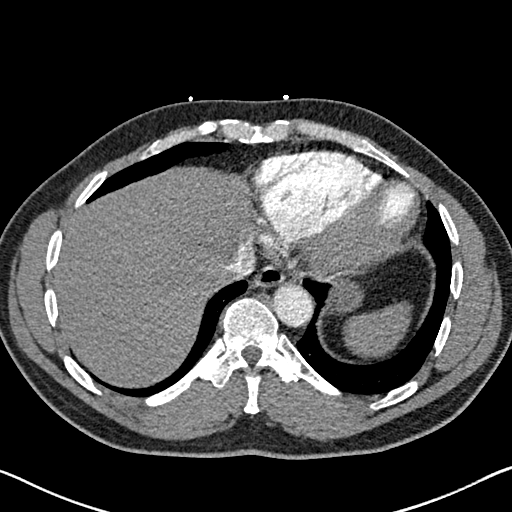
[im 84/266  lung]
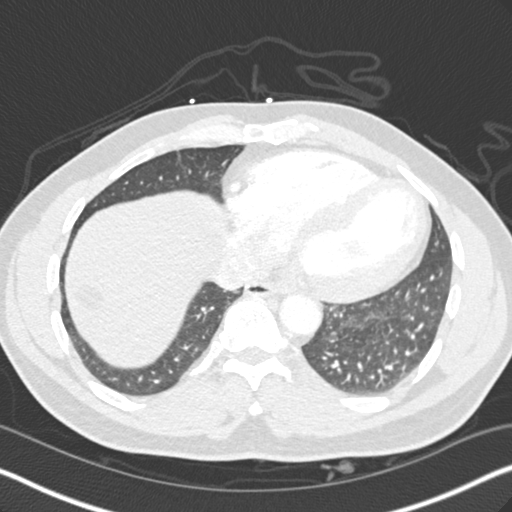
[im 112/266  soft-tissue]
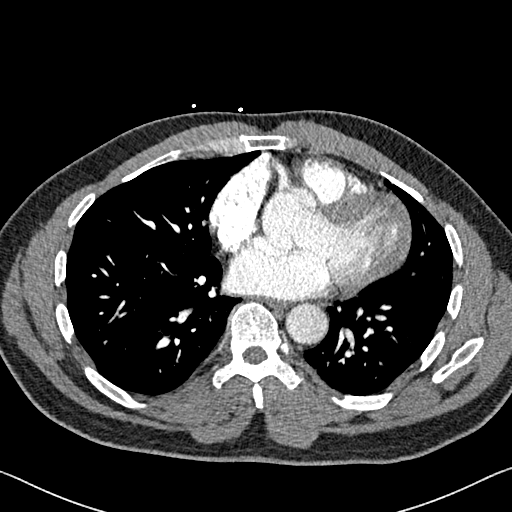
[im 126/266  lung]
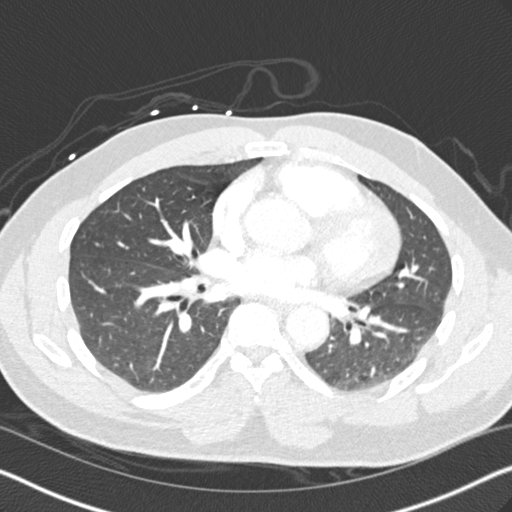
[im 140/266  soft-tissue]
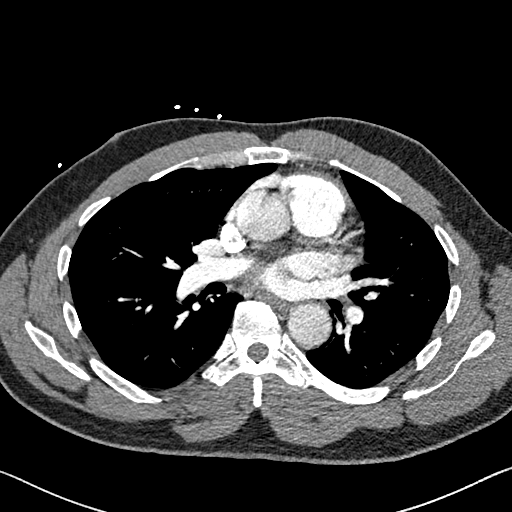
[im 154/266  lung]
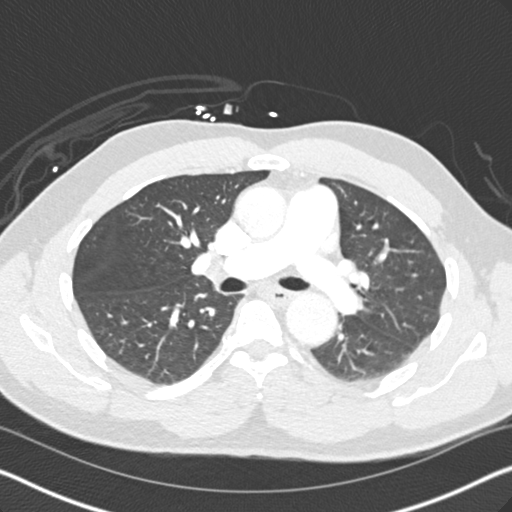
[im 182/266  soft-tissue]
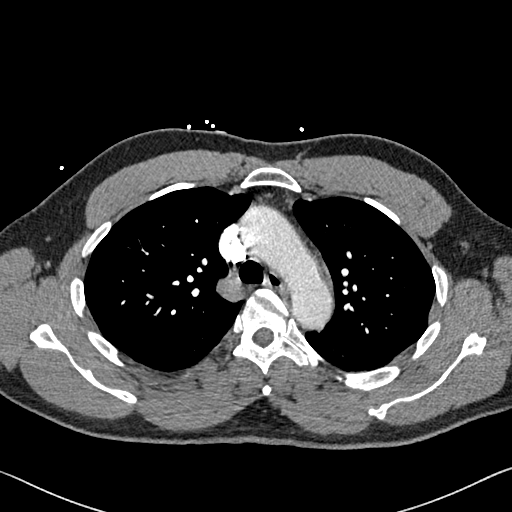
[im 196/266  lung]
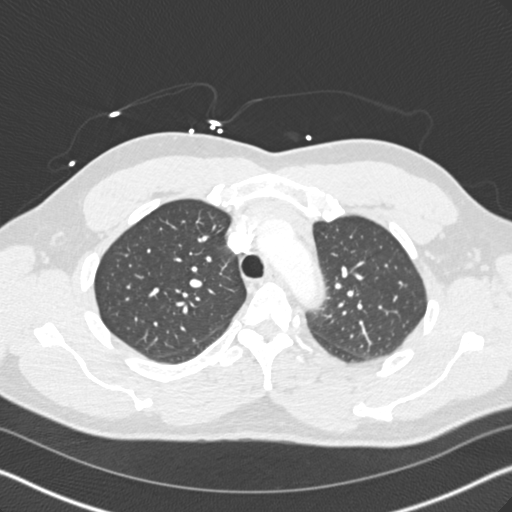
[im 210/266  soft-tissue]
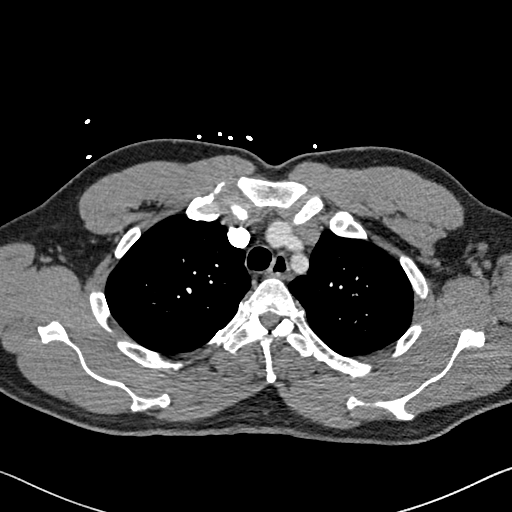
[im 238/266  lung]
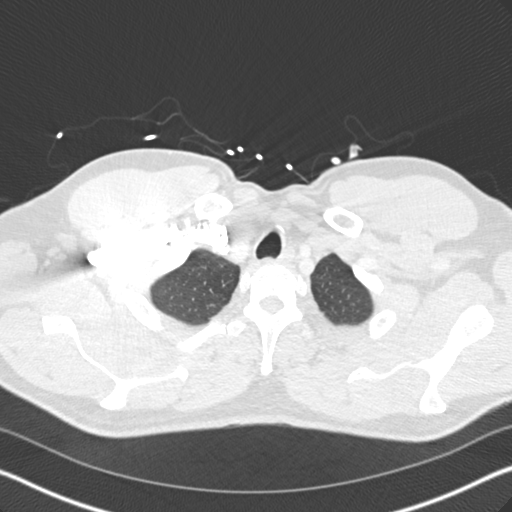
[im 252/266  soft-tissue]
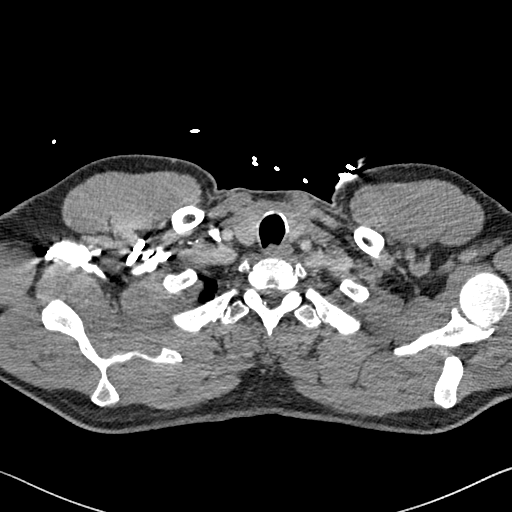

[Series 7: coronal mpr · coronal · 0.54mm/px · 3 of 103 slices shown]
[im 26/103  soft-tissue]
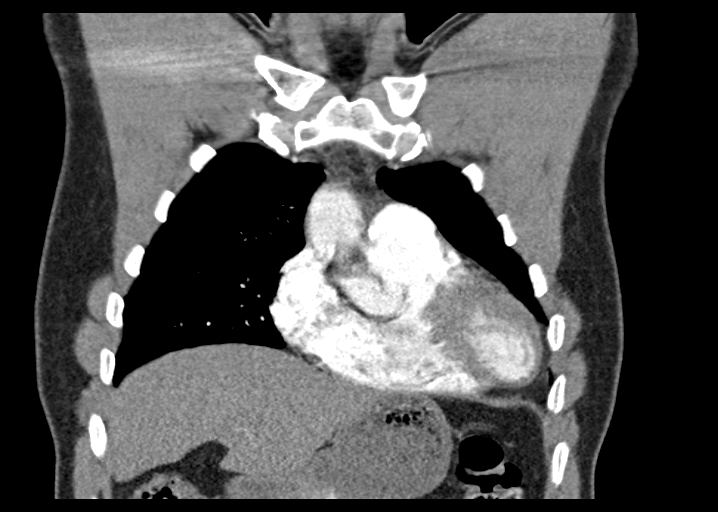
[im 52/103  soft-tissue]
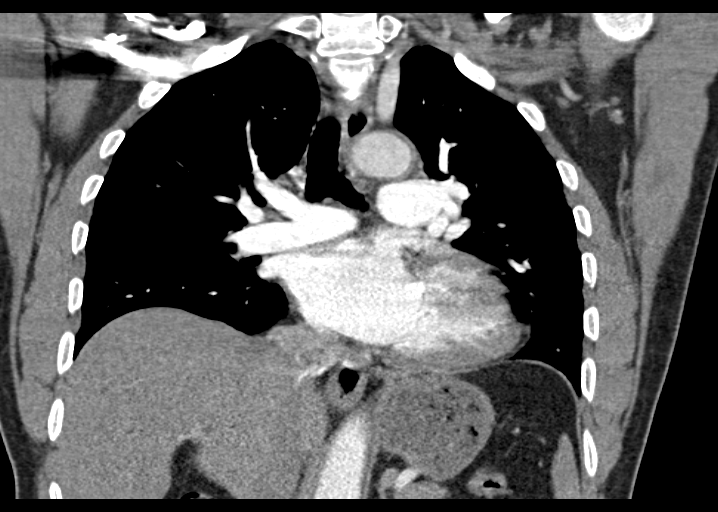
[im 77/103  soft-tissue]
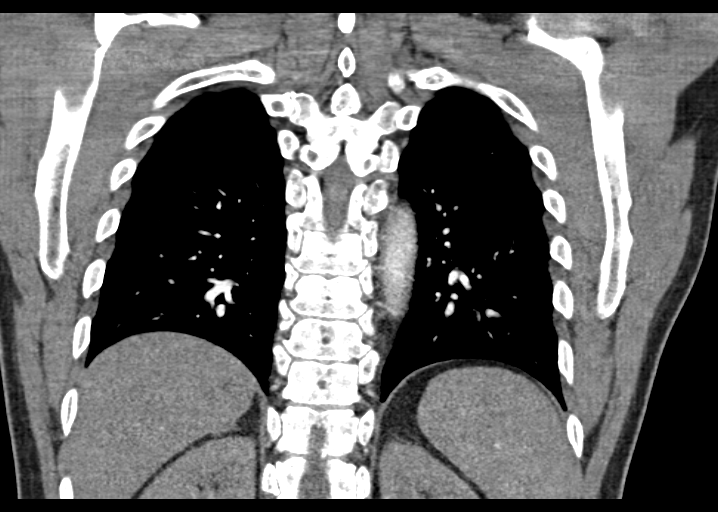

[17 of 46 positions shown; findings below may reference images not displayed]

FINDINGS: Opacification of the pulmonary arteries is good. No filling defects
within either main pulmonary artery or their branches in either lung
to suggest pulmonary embolism. Heart size upper normal to slightly
enlarged with left ventricular predominance and borderline to mild
left ventricular hypertrophy. No pericardial effusion. Mild
atherosclerosis involving the thoracic and upper abdominal aorta
with focal aneurysmal dilation of the proximal descending thoracic
aorta up to approximately 3.9 cm diameter.

Low lung volumes with mild atelectasis in the lower lobes as a
result. Lungs otherwise clear. No evidence of interstitial pulmonary
edema. No confluent airspace consolidation. No pulmonary parenchymal
nodules or masses. No pleural effusions. Central airways patent
without significant bronchial wall thickening.

No significant mediastinal, hilar, or axillary lymphadenopathy.
Visualized thyroid gland unremarkable.

Approximate 2.1 cm diameter simple cyst in the anterior segment
right lobe of liver. Prior cholecystectomy. Visualized upper abdomen
otherwise unremarkable. Bone window images unremarkable.

Review of the MIP images confirms the above findings.
IMPRESSION: 1. No evidence of pulmonary embolism.
2.  No acute cardiopulmonary disease.
3. Borderline heart size with borderline to mild left ventricular
hypertrophy.
4. Focal aneurysmal dilation of the proximal descending thoracic
aorta up to 3.9 cm diameter.
5. Benign 2 cm simple cyst in the anterior segment right lobe of
liver.

## 2016-05-04 ENCOUNTER — Other Ambulatory Visit: Payer: Self-pay | Admitting: Cardiovascular Disease

## 2016-05-24 ENCOUNTER — Other Ambulatory Visit: Payer: Self-pay | Admitting: Cardiovascular Disease

## 2016-06-10 ENCOUNTER — Other Ambulatory Visit: Payer: Self-pay | Admitting: Cardiovascular Disease

## 2016-06-10 NOTE — Telephone Encounter (Signed)
Rx(s) sent to pharmacy electronically.  

## 2016-06-15 ENCOUNTER — Ambulatory Visit (INDEPENDENT_AMBULATORY_CARE_PROVIDER_SITE_OTHER): Payer: BLUE CROSS/BLUE SHIELD | Admitting: Cardiovascular Disease

## 2016-06-15 ENCOUNTER — Encounter: Payer: Self-pay | Admitting: Cardiovascular Disease

## 2016-06-15 VITALS — BP 116/69 | HR 54 | Ht 73.0 in | Wt 214.4 lb

## 2016-06-15 DIAGNOSIS — Z87898 Personal history of other specified conditions: Secondary | ICD-10-CM

## 2016-06-15 DIAGNOSIS — Z79899 Other long term (current) drug therapy: Secondary | ICD-10-CM

## 2016-06-15 DIAGNOSIS — E785 Hyperlipidemia, unspecified: Secondary | ICD-10-CM | POA: Diagnosis not present

## 2016-06-15 DIAGNOSIS — R945 Abnormal results of liver function studies: Secondary | ICD-10-CM

## 2016-06-15 DIAGNOSIS — I2583 Coronary atherosclerosis due to lipid rich plaque: Secondary | ICD-10-CM

## 2016-06-15 DIAGNOSIS — R7989 Other specified abnormal findings of blood chemistry: Secondary | ICD-10-CM | POA: Diagnosis not present

## 2016-06-15 DIAGNOSIS — I2510 Atherosclerotic heart disease of native coronary artery without angina pectoris: Secondary | ICD-10-CM | POA: Diagnosis not present

## 2016-06-15 DIAGNOSIS — I10 Essential (primary) hypertension: Secondary | ICD-10-CM

## 2016-06-15 MED ORDER — SPIRONOLACTONE 25 MG PO TABS: 25.0000 mg | ORAL_TABLET | Freq: Every day | ORAL | 11 refills | Status: DC

## 2016-06-15 NOTE — Progress Notes (Signed)
Patient ID: [name], male   DOB: [birth year], 61 y.o.   MRN: [id number]     HPI: [name] is a 61 y.o. male who presents to the office today for a 34-monthfollow-up cardiology evaluation.  [name]  [facility] after being awakened from a deep sleep with substernal chest pressure associated with diaphoresis and mild shortness of breath.  He has a history of accelerated hypertension and previously  had blood pressures in excess of 200 bpm.  His ECG on presentation did not reveal acute ST segment changes.  He continue to experience residual episodes of chest pain and cardiology consultation was recommended.  His cardiac risk factors were notable for long-standing history of hypertension as well as family history for heart disease.  I recommended definitive cardiac catheterization.  Catheterization was done by Dr. STamala Julianand both the right and circumflex coronary arteries contained a region of kinking that in one view appeared to obstructive vessel up to 50%.  Normal LV function.  Ejection fraction was 55-60%.  He was seen in follow-up of his hospitalization by [name] 03/05/2014.  He did not experience recurrent chest pain.  He had been on a PPI.  He had had some bradycardia in the hospital and a subsequent event monitor did not reveal significant bradycardia arrhythmia and he was restarted back on metoprolol at 25 mg twice a day.  Due to concerns for sleep apnea particularly with his chest pain awakening him from sleep he underwent a sleep study on November 12 23 2015.  This suggested increased upper airway resistance syndrome but definitive sleep apnea was not demonstrated.  His AHI was 0.4; however, his RDI was 8.2.  He had an increased arousals.  There was no evidence for nocturnal myoclonus.  When I last saw him last year his blood pressure was mildly increased at 148/88.  Since he did have some issues with erectile dysfunction on metoprolol  [facility] initiation of 5 mg daily. An echo Doppler study  on 08/07/2014  showed an ejection fraction of 551-88% grade 2 diastolic dysfunction, trivial aortic insufficiency and mild dilatation of his aortic root at 44 mm.  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  When I saw him, I reviewed his laboratory and his lipid studies revealed a cholesterol 183 LDL cholesterol 120, HDL 42 and triglycerides 103.  He was started on atorvastatin 20 mg.  He was seen by Jason Ferriesand was started on spironolactone for improved blood pressure control.  Inside last saw him January 2017, [facility] Dr. HPamala Hurry  The patient tells me that he had episode of syncope following surgery.  During his evaluation he was also found to have thyroid nodule [facility]  He also had undergone recent endoscopy.  Most recently he has been on Bystolic 2.5 mg, diltiazem 240 mg, spironolactone 25 mg for hypertension.  He has been taking ranitidine 150 mg for GERD in addition to tramadol as needed results for evaluation.  Past Medical History:  Diagnosis Date  . Hypertension   . Reflux   . 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 His AHI was 0.4;  however, his RDI was 8.2. He had an increased arousals. There was no evidence for nocturnal myoclonus.  . S/P cardiac cath 01/29/14    but with area of kinking obstructing the LCX to 50%. other wise patent coronary arteries.  . Sinus bradycardia 2015   Event monitor showed no critical bradycardia, tolerating low-dose beta blocker  . Spinal stenosis     Past Surgical History:  Procedure Laterality Date  . CHOLECYSTECTOMY    . LEFT HEART  CATHETERIZATION WITH CORONARY ANGIOGRAM N/A 01/29/2014   Procedure: LEFT HEART CATHETERIZATION WITH CORONARY ANGIOGRAM;  Surgeon: Jason Crome III, MD; patent coronary arteries  but with area of kinking obstructing the LCX to 50%. EF nl        Allergies  Allergen Reactions  . Enalapril Cough  . Lyrica [Pregabalin] Palpitations  . Neurontin [Gabapentin] Palpitations    Current Outpatient Prescriptions  Medication Sig Dispense Refill  . BYSTOLIC 5 MG tablet TAKE ONE TABLET BY MOUTH DAILY - SCHEDULE APPOINTMENT FOR FUTURE REFILLS 15 tablet 0  . diltiazem (CARDIZEM CD) 240 MG 24 hr capsule Take 240 mg by mouth daily.    . ranitidine (ZANTAC) 150 MG tablet TAKE ONE TABLET BY MOUTH TWO TIMES A DAY 60 tablet 2  . spironolactone (ALDACTONE) 25 MG tablet Take 1 tablet (25 mg total) by mouth daily. 30 tablet 11  . traMADol (ULTRAM) 50 MG tablet Take 1 tablet (50 mg total) by mouth every 6 (six) hours as needed for moderate pain or severe pain. 30 tablet 0   No current facility-administered medications for this visit.     Social History   Social History  . Marital status: Married    Spouse name: N/A  . Number of children: N/A  . Years of education: N/A   Occupational History  . Not on file.   Social History Main Topics  . Smoking status: Never Smoker  . Smokeless tobacco: Never Used  . Alcohol use No  . Drug use: No  . Sexual activity: Yes   Other Topics Concern  . Not on file   Social History Narrative  . No narrative on file   Social history is notable that he is married, has 5 children.  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  He now lives in [place].  There is no tobacco use.  Family History  Problem Relation Age of Onset  . Heart failure Father   . Hypertension Father   . Lung cancer Sister   . Pancreatic cancer Sister   . Cancer Sister   . Hypertension Mother    Family history is also notable that his mother is still living at age 70.   Father died at age 5 and had undergone CABG surgery at 69.  He has 5 siblings and several sisters who have had SVT.  One sister was recently diagnosed with adrenal gland issues.  ROS General: Negative; No fevers, chills, or night sweats HEENT: Negative; No changes in vision or hearing, sinus congestion, difficulty swallowing Pulmonary: Negative; No cough, wheezing, shortness of breath, hemoptysis Cardiovascular: See HPI:  GI: Negative; No nausea, vomiting, diarrhea, or abdominal pain GU: Negative; No dysuria, hematuria, or difficulty voiding Musculoskeletal: Negative; no myalgias, joint pain, or weakness Hematologic: Negative; no easy bruising, bleeding Endocrine: Negative; no heat/cold intolerance; no diabetes, Neuro: Negative; no changes in balance, headaches Skin: Negative; No rashes or skin lesions Psychiatric: Negative; No behavioral problems, depression Sleep: Negative; No snoring,  daytime sleepiness,  hypersomnolence, bruxism, restless legs, hypnogognic hallucinations. Other comprehensive 14 point system review is negative   Physical Exam BP 116/69   Pulse (!) 54   Ht _0  (1.854 m)   Wt 214 lb 6.4 oz (97.3 kg)   BMI 28.29 kg/m    Repeat blood pressure 118/72 supine and 120/70 standing.  Wt Readings from Last 3 Encounters:  06/15/16 214 lb 6.4 oz (97.3 kg)  09/15/15 207 lb (93.9 kg)  04/07/15 220 lb (99.8 kg)   General: Alert, oriented, no distress.  Skin: normal turgor, no rashes, warm and dry HEENT: Normocephalic, atraumatic. Pupils equal round and reactive to light; sclera anicteric; extraocular muscles intact, No lid lag; Nose without nasal septal hypertrophy; Mouth/Parynx benign; Mallinpatti scale 2/3 Neck: No JVD, no carotid bruits; normal carotid upstroke Lungs: clear to ausculatation and percussion bilaterally; no wheezing or rales, normal inspiratory and expiratory effort Chest wall: without tenderness to palpitation Heart: PMI not displaced, RRR, s1 s2  normal, 1/6 systolic murmur, No diastolic murmur, no rubs, gallops, thrills, or heaves Abdomen: soft, nontender; no hepatosplenomehaly, BS+; abdominal aorta nontender and not dilated by palpation. Back: no CVA tenderness Pulses: 2+  Musculoskeletal: full range of motion, normal strength, no joint deformities Extremities: Pulses 2+, no clubbing cyanosis or edema, Homan's sign negative  Neurologic: grossly nonfocal; Cranial nerves grossly wnl Psychologic: Normal mood and affect  ECG (independently read by me): Sinus bradycardia 53 bpm.  Normal intervals.  No ST segment changes.  January 2017 ECG (independently read by me): Sinus bradycardia 55 bpm.  Borderline first-degree AV block with a PR interval at 204 ms.  No ST-T changes  July 2016 ECG (independently read by me):  Normal sinus rhythm at 63 bpm.  Borderline first-degree AV block with a PR interval at 206 ms.  ECG (independently read by me): Normal sinus rhythm at 68 bpm.  Nonspecific ST changes in lead 3.  QTc interval 414 ms.  LABS:  BMP Latest Ref Rng & Units 09/16/2015 09/15/2015 12/28/2014  Glucose 65 - 99 mg/dL 159(H) 101(H) 120(H)  BUN 6 - 20 mg/dL _1 Creatinine 0.61 - 1.24 mg/dL 0.99 1.16 1.31(H)  Sodium 135 - 145 mmol/L 135 139 139  Potassium 3.5 - 5.1 mmol/L 4.1 3.9 4.0  Chloride 101 - 111 mmol/L 99(L) 102 105  CO2 22 - 32 mmol/L _2 Calcium 8.9 - 10.3 mg/dL 9.5 9.2 9.8     Hepatic Function Latest Ref Rng & Units 09/16/2015 09/15/2015 08/07/2014  Total Protein 6.5 - 8.1 g/dL 7.0 6.3(L) 7.8  Albumin 3.5 - 5.0 g/dL 3.4(L) 3.4(L) 4.5  AST 15 - 41 U/L 42(H) 108(H) 17  ALT 17 - 63 U/L 230(H) 301(H) 17  Alk Phosphatase 38 - 126 U/L 116 110 81  Total Bilirubin 0.3 - 1.2 mg/dL 0.8 0.9 0.8  Bilirubin, Direct 0.0 - 0.3 mg/dL - - -    CBC Latest Ref Rng & Units 09/15/2015 09/15/2015 12/28/2014  WBC 4.0 - 10.5 K/uL 12.0(H) 10.8(H) 6.8  Hemoglobin 13.0 - 17.0 g/dL 13.8 13.6 14.2  Hematocrit 39.0 - 52.0 % 42.4 42.6 42.6    Platelets 150 - 400 K/uL 223 212 273   Lab Results  Component Value Date   MCV 87.8 09/15/2015   MCV 90.3 09/15/2015   MCV 87.5 12/28/2014    BNP No results found for: BNP  ProBNP    Component Value Date/Time   PROBNP <30.0 04/16/2007 0253     Lipid Panel  Component Value Date/Time   CHOL 183 08/07/2014 0958   TRIG 103 08/07/2014 0958   HDL 42 08/07/2014 0958   CHOLHDL 4.4 08/07/2014 0958   VLDL 21 08/07/2014 0958   LDLCALC 120 (H) 08/07/2014 0958     RADIOLOGY: No results found.  IMPRESSION:  1. Coronary artery disease due to lipid rich plaque   2. Essential hypertension   3. Hyperlipidemia LDL goal <70   4. Medication management   5. Elevated liver function tests   6. 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  In November 2015 he was awakened with substernal chest tightness associated with diaphoresis and shortness of breath.  Cardiac catheterization revealed 30-50% areas of apparent kinking in the circumflex and RCA.   He had been on amlodipine as well as beta blocker therapy since his catheterization.  Amlodipine was discontinued by his primary physician due to concerns of burning and tingling in his chest. I'm not certain that this was related to amlodipine.  His erectile dysfunction issues.  improved with changing from metoprolol to Bystolic.  He developed significant cervical spine disease and underwent surgery at [facility].  In June 2017 involving C3-C7.  Review of laboratory.  During that timeframe has shown elevation of liver function studies.  He also experienced a syncopal spell and was found to have a thyroid nodule.  Presently, he denies any palpitations.  At times he notes some mild dizziness if he stands abruptly, although there is no significant orthostatic  change.  His resting pulse is 54.  I have suggested that we weaned and ultimately discontinue his very low-dose Bystolic and he will reduce 2.5 mg to every other day for 3 doses and then every third day for 2 doses and then discontinue.  I'm checking chemistry profile, TSH, CBC, and lipid panel.  I will contact him regarding results and adjustments need to be made.  Otherwise, I will see him in 6 months for cardiology reevaluation. Time spent: 25 minutes  Troy Sine, MD, [facility]  06/16/2016 5:54 PM

## 2016-06-15 NOTE — Patient Instructions (Signed)
Your physician has recommended you make the following change in your medication:   1.) wean the Bystolic as stated: take 1 tablet every other day for ( 3 doses) then  1 tablet every 3rd day for 2 doses then STOP.   Your physician recommends that you return for fasting lab work . Lab slips provided to you today.   Your physician wants you to follow-up in: 6 months or sooner If needed. You will receive a reminder letter in the mail two months in advance. If you don't receive a letter, please call our office to schedule the follow-up appointment.  If you need a refill on your cardiac medications before your next appointment, please call your pharmacy.

## 2016-06-17 LAB — COMPREHENSIVE METABOLIC PANEL
ALT: 14 U/L (ref 9–46)
AST: 17 U/L (ref 10–35)
Albumin: 4 g/dL (ref 3.6–5.1)
Alkaline Phosphatase: 71 U/L (ref 40–115)
BUN: 10 mg/dL (ref 7–25)
CHLORIDE: 105 mmol/L (ref 98–110)
CO2: 28 mmol/L (ref 20–31)
Calcium: 9 mg/dL (ref 8.6–10.3)
Creat: 1.38 mg/dL — ABNORMAL HIGH (ref 0.70–1.33)
GLUCOSE: 89 mg/dL (ref 65–99)
POTASSIUM: 4 mmol/L (ref 3.5–5.3)
Sodium: 141 mmol/L (ref 135–146)
Total Bilirubin: 0.7 mg/dL (ref 0.2–1.2)
Total Protein: 6.7 g/dL (ref 6.1–8.1)

## 2016-06-17 LAB — LIPID PANEL
CHOL/HDL RATIO: 5.3 ratio — AB (ref <5.0)
Cholesterol: 190 mg/dL (ref <200)
HDL: 36 mg/dL — ABNORMAL LOW (ref >40)
LDL Cholesterol: 127 mg/dL — ABNORMAL HIGH (ref <100)
Triglycerides: 136 mg/dL (ref <150)
VLDL: 27 mg/dL (ref <30)

## 2016-06-17 LAB — CBC
HEMATOCRIT: 42 % (ref 38.5–50.0)
Hemoglobin: 14 g/dL (ref 13.2–17.1)
MCH: 29.4 pg (ref 27.0–33.0)
MCHC: 33.3 g/dL (ref 32.0–36.0)
MCV: 88.2 fL (ref 80.0–100.0)
MPV: 10 fL (ref 7.5–12.5)
Platelets: 265 10*3/uL (ref 140–400)
RBC: 4.76 MIL/uL (ref 4.20–5.80)
RDW: 14 % (ref 11.0–15.0)
WBC: 6.2 10*3/uL (ref 3.8–10.8)

## 2016-06-17 LAB — TSH: TSH: 0.97 mIU/L (ref 0.40–4.50)

## 2016-06-19 ENCOUNTER — Other Ambulatory Visit: Payer: Self-pay | Admitting: Cardiovascular Disease

## 2016-07-22 ENCOUNTER — Encounter: Payer: Self-pay | Admitting: Cardiovascular Disease

## 2016-07-22 ENCOUNTER — Ambulatory Visit (INDEPENDENT_AMBULATORY_CARE_PROVIDER_SITE_OTHER): Payer: BLUE CROSS/BLUE SHIELD | Admitting: Cardiovascular Disease

## 2016-07-22 VITALS — BP 110/70 | HR 63 | Ht 73.0 in | Wt 212.8 lb

## 2016-07-22 DIAGNOSIS — I10 Essential (primary) hypertension: Secondary | ICD-10-CM

## 2016-07-22 DIAGNOSIS — E785 Hyperlipidemia, unspecified: Secondary | ICD-10-CM | POA: Diagnosis not present

## 2016-07-22 DIAGNOSIS — R7989 Other specified abnormal findings of blood chemistry: Secondary | ICD-10-CM

## 2016-07-22 DIAGNOSIS — I2583 Coronary atherosclerosis due to lipid rich plaque: Secondary | ICD-10-CM

## 2016-07-22 DIAGNOSIS — R945 Abnormal results of liver function studies: Secondary | ICD-10-CM

## 2016-07-22 DIAGNOSIS — R001 Bradycardia, unspecified: Secondary | ICD-10-CM

## 2016-07-22 DIAGNOSIS — I2510 Atherosclerotic heart disease of native coronary artery without angina pectoris: Secondary | ICD-10-CM

## 2016-07-22 NOTE — Progress Notes (Signed)
Patient ID: [name], male   DOB: 06-27-1955, 61 y.o.   MRN: [id number]     HPI: [name] is a 61 y.o. male who presents to the office today for a one-month follow-up cardiology evaluation.  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  I had seen him in [place] emergency room when he presented on 01/29/2014 after being awakened from a deep sleep with substernal chest pressure associated with diaphoresis and mild shortness of breath.  He has a history of accelerated hypertension and previously  had blood pressures in excess of 200 bpm.  His ECG on presentation did not reveal acute ST segment changes.  He continue to experience residual episodes of chest pain and cardiology consultation was recommended.  His cardiac risk factors were notable for long-standing history of hypertension as well as family history for heart disease.  I recommended definitive cardiac catheterization.  Catheterization was done by Dr. Tamala Julian and both the right and circumflex coronary arteries contained a region of kinking that in one view appeared to obstructive vessel up to 50%.  Normal LV function.  Ejection fraction was 55-60%.  He was seen in follow-up of his hospitalization by [name] on 03/05/2014.  He did not experience recurrent chest pain.  He had been on a PPI.  He had had some bradycardia in the hospital and a subsequent event monitor did not reveal significant bradycardia arrhythmia and he was restarted back on metoprolol at 25 mg twice a day.  Due to concerns for sleep apnea particularly with his chest pain awakening him from sleep he underwent a sleep study on November 12 23 2015.  This suggested increased upper airway resistance syndrome but definitive sleep apnea was not demonstrated.  His AHI was 0.4; however, his RDI was 8.2.  He had an increased arousals.  There was no evidence for nocturnal myoclonus.  When I last saw him last year his blood pressure was mildly increased at  148/88. Since he did have some issues with erectile dysfunction on metoprolol  I changed him to [name] and recommended initiation of 5 mg daily. An echo Doppler study  on 08/07/2014  showed an ejection fraction of 41-93%, grade 2 diastolic dysfunction, trivial aortic insufficiency and mild dilatation of his aortic root at 44 mm.  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  When I saw him, I reviewed his laboratory and his lipid studies revealed a cholesterol 183 LDL cholesterol 120, HDL 42 and triglycerides 103.  He was started on atorvastatin 20 mg.  He was seen by [name] and was started on spironolactone for improved blood pressure control.  Inside last saw him January 2017, he underwent cervical C3-7 spine surgery at [facility] on 09/10/2015 by [name].  The patient tells me that he had episode of syncope following surgery.  During his evaluation he was also found to have thyroid nodule and underwent ENT evaluation at [facility].  He also had undergone  endoscopy.  Most recently he has been on Bystolic 2.5 mg, diltiazem 240 mg, spironolactone 25 mg for hypertension.  He has been taking ranitidine 150 mg for GERD in addition to tramadol as needed results for evaluation.  When I last saw him, he was bradycardic and complained of mild dizziness if he stood abruptly.  I recommended that he wean and ultimately discontinue his very dose Bystolic, which he successfully has done over 10 day period.  He  underwent laboratory which showed a TSH of 0.97.  Hemoglobin and hematocrit were stable.  Electrolytes were normal.  He had mild renal insufficiency with a creatinine at 1.38 which had increased from June 2017 at 0.99.  LFTs were normal.  Lipid studies were notable for cholesterol of 190, triglycerides 136, HDL 36, and LDL 127.  Mildly, he had had previous significant LFT elevation in June 2017 and his ALT was 301 and AST 108.  These were now entirely normal.  He presents for  follow-up evaluation.  Past Medical History:  Diagnosis Date  . Hypertension   . Reflux   . 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 His AHI was 0.4; however, his RDI was 8.2. He had an increased arousals. There was no evidence for nocturnal myoclonus.  . S/P cardiac cath 01/29/14    but with area of kinking obstructing the LCX to 50%. other wise patent coronary arteries.  . Sinus bradycardia 2015   Event monitor showed no critical bradycardia, tolerating low-dose beta blocker  . Spinal stenosis     Past Surgical History:  Procedure Laterality Date  . CHOLECYSTECTOMY    . LEFT HEART CATHETERIZATION WITH CORONARY ANGIOGRAM N/A 01/29/2014   Procedure: LEFT HEART CATHETERIZATION WITH CORONARY ANGIOGRAM;  Surgeon: Jason Crome III, MD; patent coronary arteries  but with area of kinking obstructing the LCX to 50%. EF nl        Allergies  Allergen Reactions  . Enalapril Cough  . Lyrica [Pregabalin] Palpitations  . Neurontin [Gabapentin] Palpitations    Current Outpatient Prescriptions  Medication Sig Dispense Refill  . diltiazem (CARDIZEM CD) 240 MG 24 hr capsule Take 240 mg by mouth daily.    . ranitidine (ZANTAC) 150 MG tablet TAKE ONE TABLET BY MOUTH TWO TIMES A DAY 60 tablet 2  . spironolactone (ALDACTONE) 25 MG tablet Take 1 tablet (25 mg total) by mouth daily. 30 tablet 11  . traMADol (ULTRAM) 50 MG tablet Take 1 tablet (50 mg total) by mouth every 6 (six) hours as needed for moderate pain or severe pain. 30 tablet 0   No current facility-administered medications for this visit.     Social History   Social History  . Marital status: Married    Spouse name: N/A  . Number of children: N/A  . Years of education: N/A   Occupational History  . Not on file.   Social History Main Topics  . Smoking status: Never Smoker  . Smokeless tobacco: Never Used  .  Alcohol use No  . Drug use: No  . Sexual activity: Yes   Other Topics Concern  . Not on file   Social History Narrative  . No narrative on file   Social history is notable that he is married, has 5 children.  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  He now lives in [place].  There is no tobacco use.  Family History  Problem Relation Age of Onset  . Heart failure Father   . Hypertension Father   . Lung cancer Sister   . Pancreatic cancer Sister   . Cancer Sister   . Hypertension Mother    Family history is also notable that his mother is still living at age 65.  Father died at age 34 and had undergone CABG surgery at 49.  He has 5 siblings and several sisters who have had SVT.  One sister was recently diagnosed with adrenal gland issues.  ROS General: Negative; No fevers, chills, or night sweats HEENT: Negative; No changes in vision or hearing, sinus congestion, difficulty swallowing Pulmonary: Negative; No cough, wheezing, shortness of breath, hemoptysis Cardiovascular: See HPI:  GI: Negative; No nausea, vomiting, diarrhea, or abdominal pain GU: Negative; No dysuria, hematuria, or difficulty voiding Musculoskeletal: Negative; no myalgias, joint pain, or weakness Hematologic: Negative; no easy bruising, bleeding Endocrine: Negative; no heat/cold intolerance; no diabetes, Neuro: Negative; no changes in balance, headaches Skin: Negative; No rashes or skin lesions Psychiatric: Negative; No behavioral problems, depression Sleep: Negative; No snoring,  daytime sleepiness, hypersomnolence, bruxism, restless legs, hypnogognic hallucinations. Other comprehensive 14 point system review is negative   Physical Exam BP 110/70   Pulse 63   Ht '6\' 1"'$  (1.854 m)   Wt 212 lb 12.8 oz (96.5 kg)   BMI 28.08 kg/m    Repeat blood pressure 116/70   Wt Readings from Last 3 Encounters:  07/22/16 212 lb 12.8 oz (96.5 kg)  06/15/16 214 lb 6.4 oz (97.3 kg)  09/15/15  207 lb (93.9 kg)   General: Alert, oriented, no distress.  Skin: normal turgor, no rashes, warm and dry HEENT: Normocephalic, atraumatic. Pupils equal round and reactive to light; sclera anicteric; extraocular muscles intact, No lid lag; Nose without nasal septal hypertrophy; Mouth/Parynx benign; Mallinpatti scale 2/3 Neck: No JVD, no carotid bruits; normal carotid upstroke Lungs: clear to ausculatation and percussion bilaterally; no wheezing or rales, normal inspiratory and expiratory effort Chest wall: without tenderness to palpitation Heart: PMI not displaced, RRR, s1 s2 normal, 1/6 systolic murmur, No diastolic murmur, no rubs, gallops, thrills, or heaves Abdomen: soft, nontender; no hepatosplenomehaly, BS+; abdominal aorta nontender and not dilated by palpation. Back: no CVA tenderness Pulses: 2+  Musculoskeletal: full range of motion, normal strength, no joint deformities Extremities: Pulses 2+, no clubbing cyanosis or edema, Homan's sign negative  Neurologic: grossly nonfocal; Cranial nerves grossly wnl Psychologic: Normal mood and affect  ECG (independently read by me): Normal sinus rhythm at 63 bpm.  Borderline first-degree AV block with a PR interval of 204 ms.  No ST segment changes  March 2018 ECG (independently read by me): Sinus bradycardia 53 bpm.  Normal intervals.  No ST segment changes.  January 2017 ECG (independently read by me): Sinus bradycardia 55 bpm.  Borderline first-degree AV block with a PR interval at 204 ms.  No ST-T changes  July 2016 ECG (independently read by me):  Normal sinus rhythm at 63 bpm.  Borderline first-degree AV block with a PR interval at 206 ms.  ECG (independently read by me): Normal sinus rhythm at 68 bpm.  Nonspecific ST changes in lead 3.  QTc interval 414 ms.  LABS:  BMP Latest Ref Rng & Units 06/15/2016 09/16/2015 09/15/2015  Glucose 65 - 99 mg/dL 89 159(H) 101(H)  BUN 7 - 25 mg/dL '10 12 8  '$ Creatinine 0.70 - 1.33 mg/dL 1.38(H) 0.99 1.16   Sodium 135 - 146 mmol/L 141 135 139  Potassium 3.5 - 5.3 mmol/L 4.0 4.1 3.9  Chloride 98 - 110 mmol/L 105 99(L) 102  CO2 20 - 31 mmol/L '28 28 29  '$ Calcium 8.6 - 10.3 mg/dL 9.0 9.5 9.2     Hepatic Function Latest Ref Rng & Units 06/15/2016 09/16/2015 09/15/2015  Total Protein 6.1 - 8.1 g/dL 6.7 7.0 6.3(L)  Albumin 3.6 - 5.1 g/dL 4.0 3.4(L) 3.4(L)  AST 10 - 35 U/L 17 42(H) 108(H)  ALT 9 -  46 U/L 14 230(H) 301(H)  Alk Phosphatase 40 - 115 U/L 71 116 110  Total Bilirubin 0.2 - 1.2 mg/dL 0.7 0.8 0.9  Bilirubin, Direct 0.0 - 0.3 mg/dL - - -    CBC Latest Ref Rng & Units 06/15/2016 09/15/2015 09/15/2015  WBC 3.8 - 10.8 K/uL 6.2 12.0(H) 10.8(H)  Hemoglobin 13.2 - 17.1 g/dL 14.0 13.8 13.6  Hematocrit 38.5 - 50.0 % 42.0 42.4 42.6  Platelets 140 - 400 K/uL 265 223 212   Lab Results  Component Value Date   MCV 88.2 06/15/2016   MCV 87.8 09/15/2015   MCV 90.3 09/15/2015    BNP No results found for: BNP  ProBNP    Component Value Date/Time   PROBNP <30.0 04/16/2007 0253     Lipid Panel     Component Value Date/Time   CHOL 190 06/15/2016 1031   TRIG 136 06/15/2016 1031   HDL 36 (L) 06/15/2016 1031   CHOLHDL 5.3 (H) 06/15/2016 1031   VLDL 27 06/15/2016 1031   LDLCALC 127 (H) 06/15/2016 1031     RADIOLOGY: No results found.  IMPRESSION:  1. Coronary artery disease due to lipid rich plaque   2. Essential hypertension   3. Hyperlipidemia LDL goal <70   4. Elevated liver function tests ; resolved  5. 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  In November 2015 he was awakened with substernal chest tightness associated with diaphoresis and shortness of breath.  Cardiac catheterization revealed 30-50% areas of apparent kinking in the circumflex and RCA.   He had been on  amlodipine as well as beta blocker therapy since his catheterization.  Amlodipine was discontinued by his primary physician due to concerns of burning and tingling in his chest. I'm not certain that this was related to amlodipine.  His erectile dysfunction  improved with changing from metoprolol to Bystolic.  He developed significant cervical spine disease and underwent surgery at [facility] in June 2017 involving C3-C7 and had significant elevation of liver function studies.  He was told that this may be due to fatty liver.  His LFTs have now entirely normalized.  When he was last evaluated, he was bradycardic and had complaints of dizziness.  I am not certain if this was the etiology, particularly since his triglycerides typically have been normal and his HDL has not been markedly low.  He was slowly weaned off his low-dose of Bystolic.  His dizziness has resolved.  His resting pulse today is in the 60s.  His blood pressure continues to be stable on diltiazem 240 mg daily and spironolactone 25 mg.  I reviewed his recent laboratory.  Lipid studies are increased with a LDL cholesterol of 127.  With his previous LFT elevation.  I will not start statin therapy.  We discussed improved dietary measures and particularly significant reduction in fried foods.  If with dietary adjustment his LDL continues to be elevated, Zetia may be added to his medical regimen.  He has a history of thyroid nodule and had undergone ENT evaluation at [facility].  TSH is normal.  I will see him in 6 months for reevaluation.  Troy Sine, MD, [facility]  07/23/2016 10:30 AM

## 2016-07-22 NOTE — Patient Instructions (Signed)
Your physician wants you to follow-up in: 6 months or sooner if needed. You will receive a reminder letter in the mail two months in advance. If you don't receive a letter, please call our office to schedule the follow-up appointment.   If you need a refill on your cardiac medications before your next appointment, please call your pharmacy. 

## 2016-08-06 ENCOUNTER — Other Ambulatory Visit: Payer: Self-pay | Admitting: Physician Assistant

## 2016-08-08 NOTE — Telephone Encounter (Signed)
Rx(s) sent to pharmacy electronically.  

## 2017-03-22 ENCOUNTER — Other Ambulatory Visit: Payer: Self-pay | Admitting: Cardiovascular Disease

## 2017-05-10 ENCOUNTER — Encounter (HOSPITAL_COMMUNITY): Payer: Self-pay

## 2017-05-10 ENCOUNTER — Emergency Department (HOSPITAL_COMMUNITY)
Admission: EM | Admit: 2017-05-10 | Discharge: 2017-05-10 | Disposition: A | Discharge status: Home / Self Care | Payer: BLUE CROSS/BLUE SHIELD | Attending: Emergency Medicine | Admitting: Emergency Medicine

## 2017-05-10 DIAGNOSIS — R109 Unspecified abdominal pain: Secondary | ICD-10-CM | POA: Insufficient documentation

## 2017-05-10 DIAGNOSIS — Z5321 Procedure and treatment not carried out due to patient leaving prior to being seen by health care provider: Secondary | ICD-10-CM | POA: Insufficient documentation

## 2017-05-10 NOTE — ED Triage Notes (Signed)
Pt took his first dose of lipitor yesterday and shortly after his stomach started hurting and he was sweating Pt also took an aspirin without food ans his primary thought it may be the cause Today he took the aspirin with food and had issues and took another dose of lipitor this evening and he has the same reaction of upper abdominal pain and sweating

## 2017-05-12 NOTE — ED Notes (Signed)
05/12/2017, Attempted follow-up call.  No answer 

## 2017-07-04 ENCOUNTER — Other Ambulatory Visit: Payer: Self-pay | Admitting: Cardiovascular Disease

## 2017-07-04 NOTE — Telephone Encounter (Signed)
REFILL 

## 2017-08-01 ENCOUNTER — Other Ambulatory Visit: Payer: Self-pay | Admitting: Cardiovascular Disease

## 2017-08-01 NOTE — Telephone Encounter (Signed)
REFILL 

## 2017-10-16 ENCOUNTER — Other Ambulatory Visit: Payer: Self-pay | Admitting: Cardiovascular Disease

## 2017-11-10 IMAGING — US US THYROID BIOPSY
1 series · 13 of 15 positions shown · non-contrast
Comparison: Ultrasound done 07/10/2015

INDICATION: Right mid pole thyroid nodule which measures 1.8 x 2.6 x 1.2 cm with
mixed echogenicity, partially cystic, partially solid. Request for
ultrasound guided fine needle aspirate biopsy.

EXAM:
ULTRASOUND GUIDED NEEDLE ASPIRATE BIOPSY OF THE THYROID GLAND
MEDICATIONS:
1% Lidocaine.
ANESTHESIA/SEDATION:
No sedation medication given.

[Series 1: us thyroid biopsy · 0.07mm/px · 15 acquisitions, 13 frames shown]
[im 1/15]
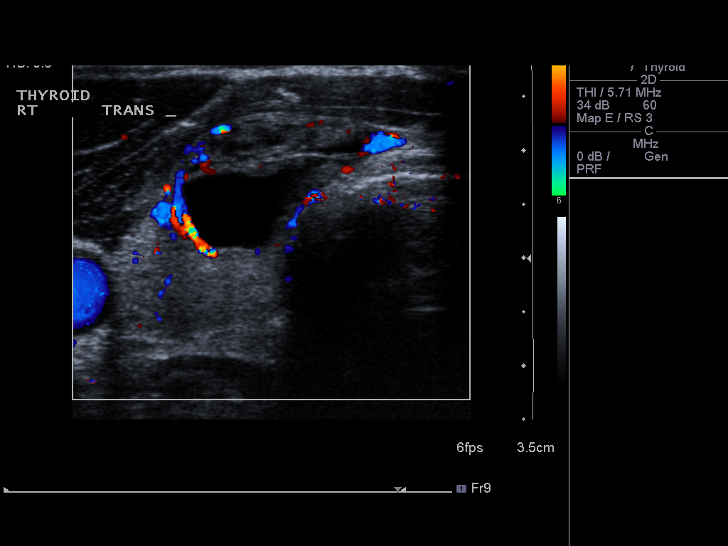
[im 2/15]
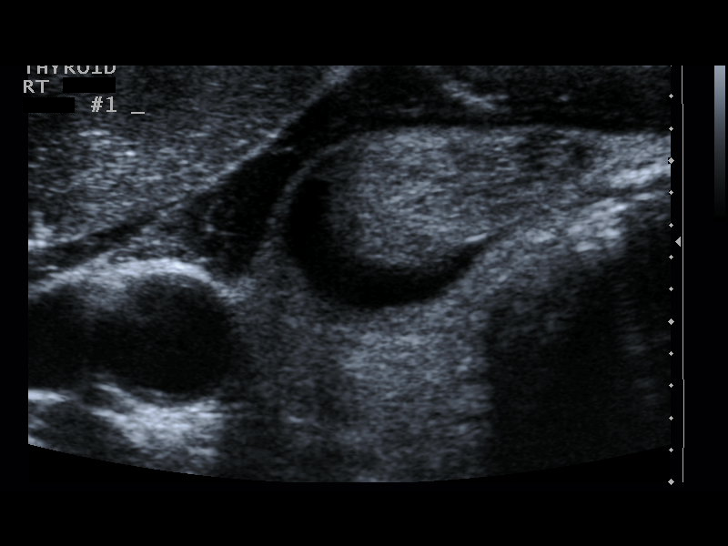
[im 3/15]
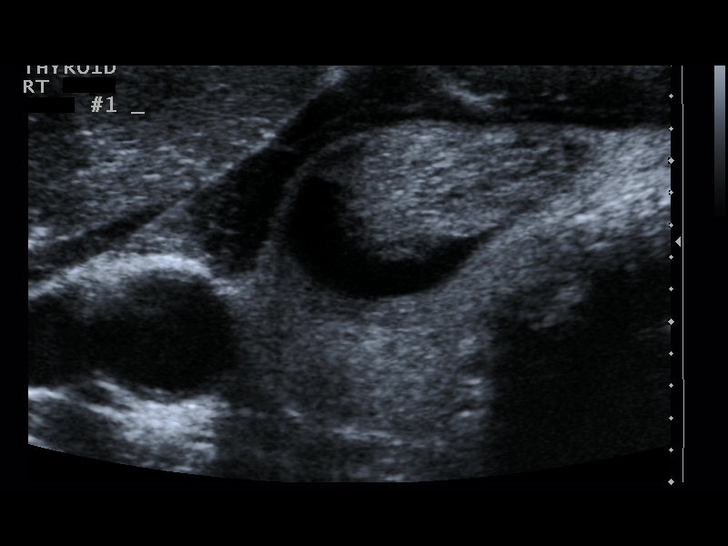
[im 5/15]
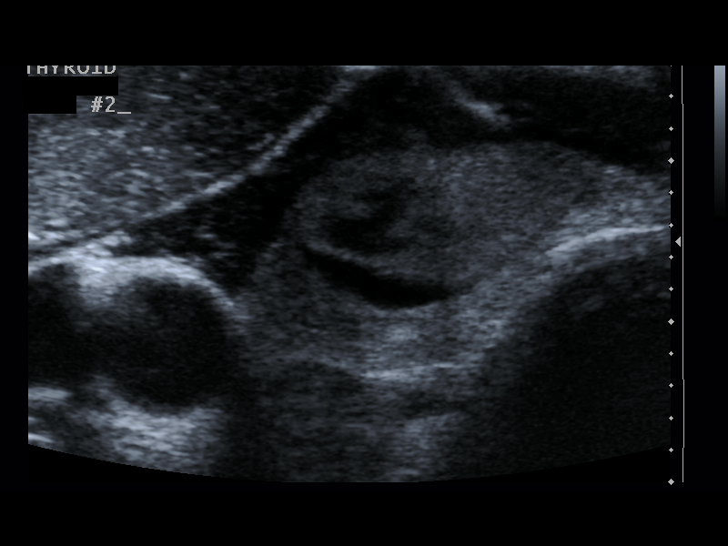
[im 6/15]
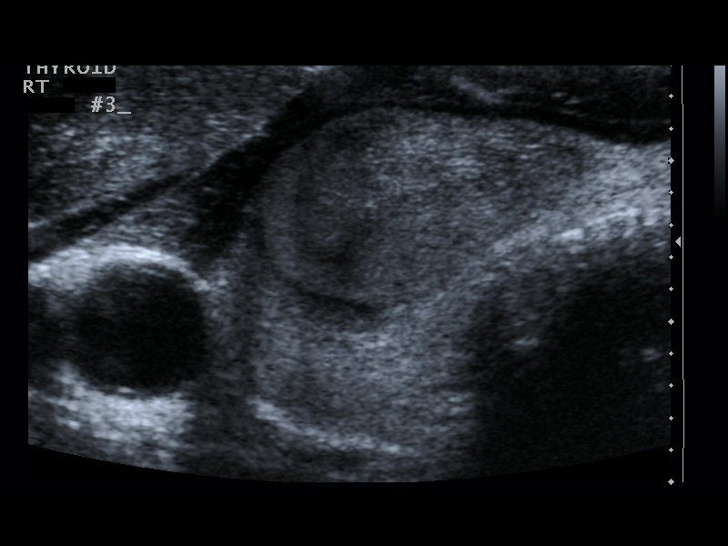
[im 7/15]
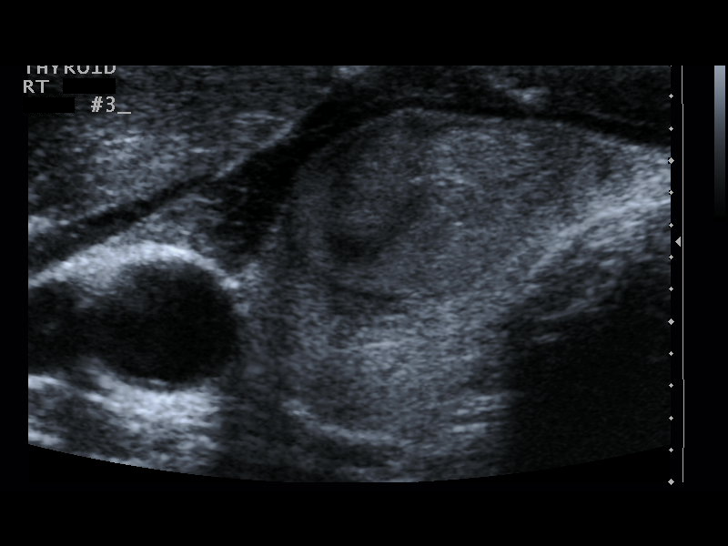
[im 8/15]
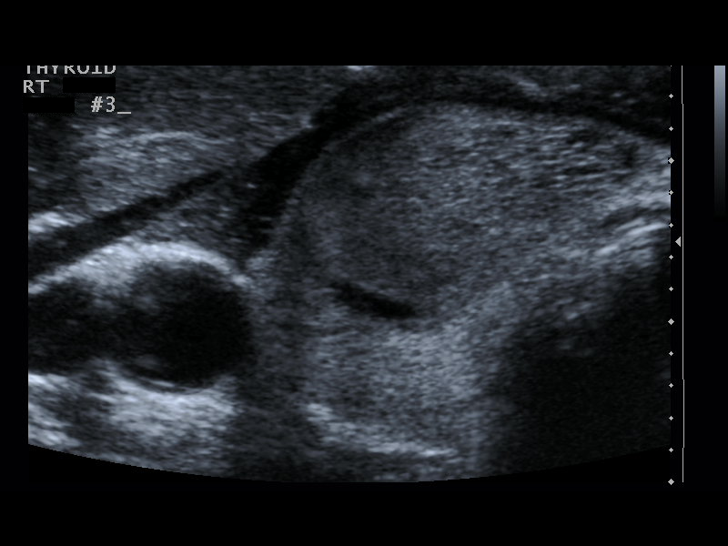
[im 9/15]
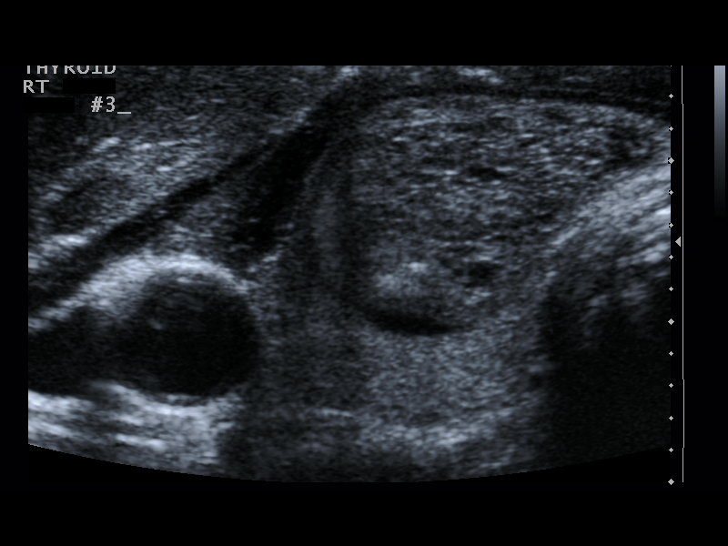
[im 10/15]
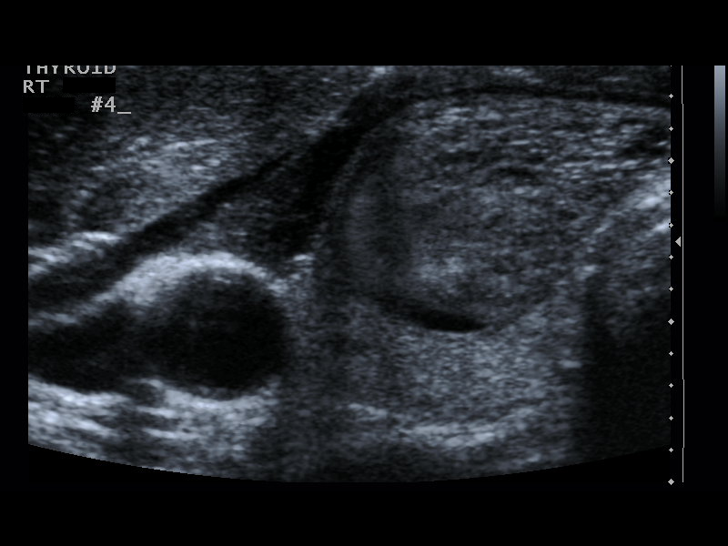
[im 11/15]
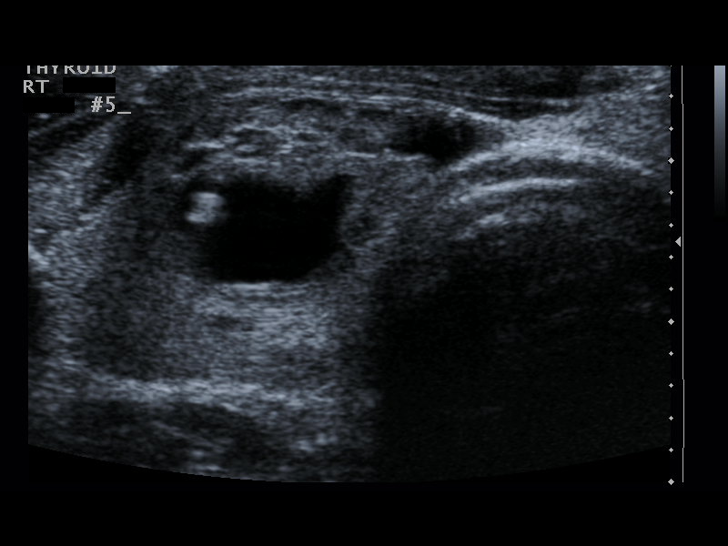
[im 13/15]
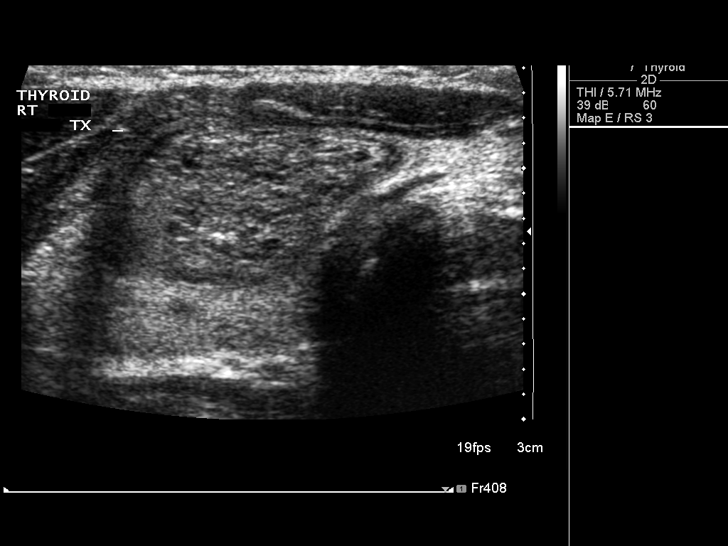
[im 14/15]
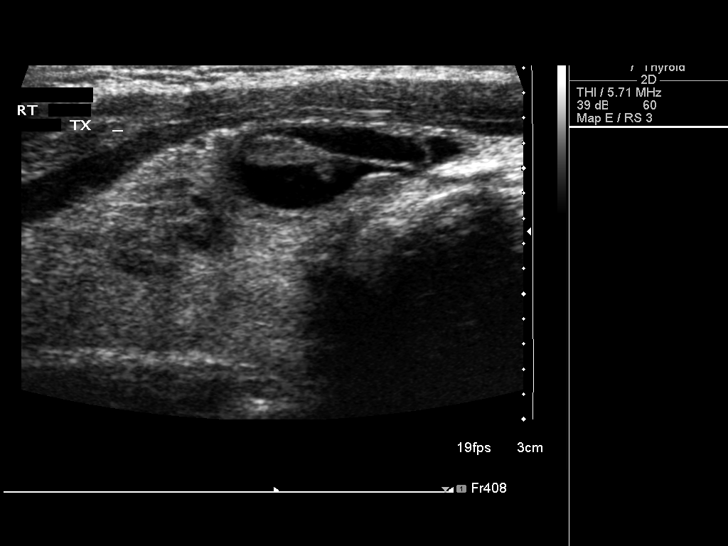
[im 15/15]
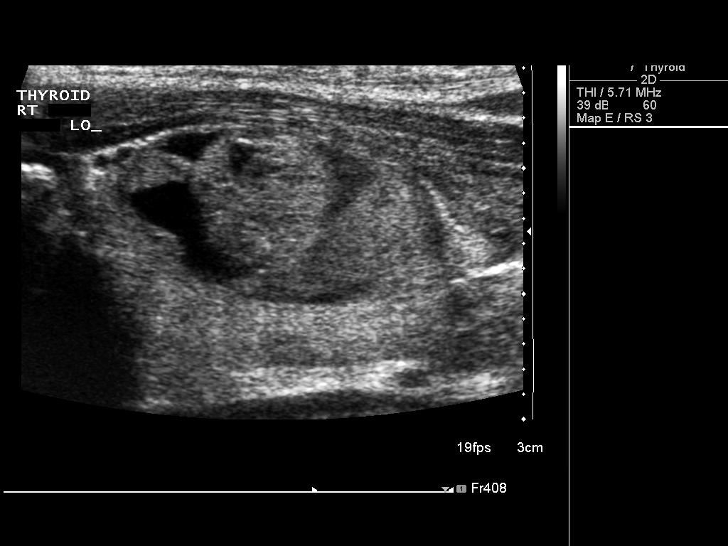

[13 of 15 positions shown; findings below may reference images not displayed]

COMPLICATIONS:
None immediate.

PROCEDURE:
Thyroid biopsy was thoroughly discussed with the patient and
questions were answered. The benefits, risks, alternatives, and
complications were also discussed. The patient understands and
wishes to proceed with the procedure. Written consent was obtained.

Ultrasound was performed to localize and mark an adequate site for
the biopsy. The patient was then prepped and draped in a normal
sterile fashion. Local anesthesia was provided with 1% lidocaine.
Using direct ultrasound guidance, 4 passes were made using needles
into the nodule within the right mid pole of the thyroid. Ultrasound
was used to confirm needle placements on all occasions. Specimens
were sent to Pathology for analysis.
IMPRESSION: Ultrasound guided needle aspirate biopsy performed of the right mid
pole thyroid nodule.

## 2018-02-06 ENCOUNTER — Telehealth: Payer: Self-pay | Admitting: Cardiovascular Disease

## 2018-02-06 NOTE — Telephone Encounter (Signed)
New message   Patient c/o Palpitations:  High priority if patient c/o lightheadedness, shortness of breath, or chest pain  1) How long have you had palpitations/irregular HR/ Afib? Are you having the symptoms now? 3 to 4 days, patient is not having palpitations at this time   2) Are you currently experiencing lightheadedness, SOB or CP? lightheadedness  3) Do you have a history of afib (atrial fibrillation) or irregular heart rhythm? yes  4) Have you checked your BP or HR? (document readings if available): 129/76 bp for today 75hr   5) Are you experiencing any other symptoms? Stabbing feeling in chest

## 2018-02-06 NOTE — Telephone Encounter (Signed)
Spoke with pt who states he has been having stabbing chest pain and palpation off and on for a few weeks. He states today he felt light headed and dizzy. Today's BP 129/76 HR 75. Appointment scheduled for pt to be seen by Azalee CourseHao Meng, PA on 02/08/18 at 130. Pt also advised if he start having that chest pain between now and his appointment date, to report to ED for further evaluations. Pt verbalized understanding.

## 2018-02-07 ENCOUNTER — Telehealth: Payer: Self-pay | Admitting: Cardiovascular Disease

## 2018-02-07 ENCOUNTER — Ambulatory Visit (INDEPENDENT_AMBULATORY_CARE_PROVIDER_SITE_OTHER): Payer: Self-pay | Admitting: Adult Health

## 2018-02-07 ENCOUNTER — Encounter: Payer: Self-pay | Admitting: Adult Health

## 2018-02-07 VITALS — BP 128/78 | HR 65 | Ht 73.0 in | Wt 204.6 lb

## 2018-02-07 DIAGNOSIS — R079 Chest pain, unspecified: Secondary | ICD-10-CM

## 2018-02-07 DIAGNOSIS — I10 Essential (primary) hypertension: Secondary | ICD-10-CM

## 2018-02-07 DIAGNOSIS — K219 Gastro-esophageal reflux disease without esophagitis: Secondary | ICD-10-CM

## 2018-02-07 DIAGNOSIS — I2583 Coronary atherosclerosis due to lipid rich plaque: Secondary | ICD-10-CM

## 2018-02-07 DIAGNOSIS — I2510 Atherosclerotic heart disease of native coronary artery without angina pectoris: Secondary | ICD-10-CM

## 2018-02-07 MED ORDER — OMEPRAZOLE 40 MG PO CPDR: 40.0000 mg | DELAYED_RELEASE_CAPSULE | Freq: Every day | ORAL | 1 refills | Status: DC

## 2018-02-07 NOTE — Telephone Encounter (Signed)
New Message   Pt's wife calling, states they called yesterday to get an appt with Tresa EndoKelly due to sob and chest pain, nurse scheduled for 11/14 at 1:30 but pt has to go out of town on a trip 4am tomorrow and they are wanting to know if he can get worked in today. Please call

## 2018-02-07 NOTE — Progress Notes (Signed)
Cardiology Office Note   Date:  02/07/2018   ID:  Jeanell Sparrow, DOB [birth year], MRN [id number]  PCP:  Harvest Forest, MD  Cardiologist:  [facility]  Chief Complaint  Patient presents with  . Chest Pain     History of Present Illness: [name] is a 62 y.o. male who presents for ongoing assessment and management of CAD, with cardiac cath 2015 with 30%-50% areas of apparent kinking of the Cx and RCA. He is also being followed for HTN, and HL He was continued on current medication regimen on last office visit on 07/22/2016. He called our office today with complaints of chest pain and dyspnea.   He states the stopped taking Zantac due to product recall. A week later he began to have recurrence of GERD symptoms. A few days later he began to have stabbing left sided chest pain and right arm tightness. Some increased dyspnea. With his hx of CAD he wanted to be checked out. He has not been seen by GI since 2012.   Pain can occur at rest, while driving a car, or watching TV. Not associated with exertion.   Past Medical History:  Diagnosis Date  . Hypertension   . Reflux   . 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 His AHI was 0.4; however, his RDI was 8.2. He had an increased arousals. There was no evidence for nocturnal myoclonus.  . S/P cardiac cath 01/29/14    but with area of kinking obstructing the LCX to 50%. other wise patent coronary arteries.  . Sinus bradycardia 2015   Event monitor showed no critical bradycardia, tolerating low-dose beta blocker  . Spinal stenosis     Past Surgical History:  Procedure Laterality Date  . CHOLECYSTECTOMY    . LEFT HEART CATHETERIZATION WITH CORONARY ANGIOGRAM N/A 01/29/2014   Procedure: LEFT HEART CATHETERIZATION WITH CORONARY ANGIOGRAM;  Surgeon: Lyn Records III, MD; patent coronary arteries  but with area of kinking  obstructing the LCX to 50%. EF nl         Current Outpatient Medications  Medication Sig Dispense Refill  . diltiazem (CARDIZEM CD) 240 MG 24 hr capsule Take 240 mg by mouth daily.    Marland Kitchen spironolactone (ALDACTONE) 25 MG tablet Take 1 tablet (25 mg total) by mouth daily. NEED OV. 30 tablet 3  . omeprazole (PRILOSEC) 40 MG capsule Take 1 capsule (40 mg total) by mouth daily. 30 capsule 1   No current facility-administered medications for this visit.     Allergies:   Enalapril; Lyrica [pregabalin]; and Neurontin [gabapentin]    Social History:  The patient  reports that he has never smoked. He has never used smokeless tobacco. He reports that he does not drink alcohol or use drugs.   Family History:  The patient's family history includes Cancer in his sister; Heart failure in his father; Hypertension in his father and mother; Lung cancer in his sister; Pancreatic cancer in his sister.    ROS: All other systems are reviewed and negative. Unless otherwise mentioned in H&P    PHYSICAL EXAM: VS:  BP 128/78   Pulse 65   Ht 6\' 1"  (1.854 m)   Wt 204 lb 9.6 oz (92.8 kg)   SpO2 98%   BMI 26.99 kg/m  , BMI Body mass index is 26.99 kg/m. GEN: Well nourished, well developed, in no acute distress HEENT: normal Neck: no JVD, carotid bruits,  or masses Cardiac: RRR; no murmurs, rubs, or gallops,no edema  Respiratory:  Clear to auscultation bilaterally, normal work of breathing GI: soft, nontender, nondistended, + BS MS: no deformity or atrophy Skin: warm and dry, no rash Neuro:  Strength and sensation are intact Psych: euthymic mood, full affect   EKG:  NSR rat eo 65 bpm.   Recent Labs: No results found for requested labs within last 8760 hours.    Lipid Panel    Component Value Date/Time   CHOL 190 06/15/2016 1031   TRIG 136 06/15/2016 1031   HDL 36 (L) 06/15/2016 1031   CHOLHDL 5.3 (H) 06/15/2016 1031   VLDL 27 06/15/2016 1031   LDLCALC 127 (H) 06/15/2016 1031      Wt  Readings from Last 3 Encounters:  02/07/18 204 lb 9.6 oz (92.8 kg)  07/22/16 212 lb 12.8 oz (96.5 kg)  06/15/16 214 lb 6.4 oz (97.3 kg)     Other studies Reviewed: Echocardiogram 08/07/2014  Left ventricle: The cavity size was normal. Wall thickness was   increased in a pattern of mild LVH. Systolic function was normal.   The estimated ejection fraction was in the range of 55% to 60%.   Wall motion was normal; there were no regional wall motion   abnormalities. Features are consistent with a pseudonormal left   ventricular filling pattern, with concomitant abnormal relaxation   and increased filling pressure (grade 2 diastolic dysfunction). - Aortic valve: There was no stenosis. There was trivial   regurgitation. - Aorta: Dilated aortic root. Aortic root dimension: 44 mm (ED). - Mitral valve: There was trivial regurgitation. - Right ventricle: The cavity size was normal. Systolic function   was normal. - Right atrium: Prominent Chiari network. - Pulmonary arteries: No complete TR doppler jet so unable to   estimate PA systolic pressure. - Inferior vena cava: The vessel was normal in size. The   respirophasic diameter changes were in the normal range (= 50%),   consistent with normal central venous pressure. - Impressions: Normal LV size with mild LV hypertrophy. EF 55-60%   with moderate diastolic dysfunction. Normal RV size and systolic   function. No significant valvular abnormalities. Dilated aortic   root.  Impressions:  - Normal LV size with mild LV hypertrophy. EF 55-60% with moderate   diastolic dysfunction. Normal RV size and systolic function. No   significant valvular abnormalities. Dilated aortic root.  ASSESSMENT AND PLAN:  1. CAD: Cath per Dr. Katrinka BlazingSmith in 2015 revealed kinking of the LCx and RCA. I doubt his symptoms are related to CAD as they are associated with stopping H2 Blocker-Zantac. However with right arm tightness and associated dyspnea, will move forward  with a GXT to evaluate his response to exercise for evidence of ischemic changes. He will continue diltiazem.   2. GERD; I will start him on omeprazole 40 mg daily. He will be referred back to GI for further recommendations. He has seen Dr. Leone PayorGessner in the past.   3. Hypertension: Continue diltiazem and spironolactone.    Current medicines are reviewed at length with the patient today.    Labs/ tests ordered today include: GXT Bettey MareKathryn M. Liborio NixonLawrence DNP, ANP, AACC   02/07/2018 4:11 PM    [facility] [address] Office [phone] Fax [phone]

## 2018-02-07 NOTE — Patient Instructions (Signed)
Medication Instructions:  NO CHANGES- Your physician recommends that you continue on your current medications as directed. Please refer to the Current Medication list given to you today.  If you need a refill on your cardiac medications before your next appointment, please call your pharmacy.  Labwork: If you have labs (blood work) drawn today and your tests are completely normal, you will receive your results only by: Marland Kitchen. MyChart Message (if you have MyChart) OR . A paper copy in the mail If you have any lab test that is abnormal or we need to change your treatment, we will call you to review the results.  Testing/Procedures: Your physician has requested that you have an exercise tolerance test, this is a screening tool to track your fitness level. This test evaluates the your exercise capacity by measuring cardiovascular response to exercise, the stress response is induced by exercise (exercise-treadmill).  Graded exercise test is also known as maximal exercise test or stress EKG test  . Please also follow instruction sheet given.  Special Instructions: Referral to gastro-Dr Leone PayorGessner  Follow-Up: . You will need a follow up appointment in 1 month.   You may see  DR Richelle ItoKELLY,  Kathryn Lawrence, DNP, ANP or one of the following Advanced Practice Providers on your designated Care Team:   . Azalee CourseHao Meng, PA-C . Micah FlesherAngela Duke, PA-C  At [facility], you and your health needs are our priority.  As part of our continuing mission to provide you with exceptional heart care, we have created designated Provider Care Teams.  These Care Teams include your primary Cardiologist (physician) and Advanced Practice Providers (APPs -  Physician Assistants and Nurse Practitioners) who all work together to provide you with the care you need, when you need it.  Thank you for choosing [facility]!!

## 2018-02-07 NOTE — Telephone Encounter (Signed)
Spoke to wife appointment reschedule for today. States patient will here.

## 2018-02-08 ENCOUNTER — Ambulatory Visit: Payer: Self-pay | Admitting: Physician Assistant

## 2018-02-14 ENCOUNTER — Other Ambulatory Visit: Payer: Self-pay | Admitting: Cardiovascular Disease

## 2018-02-14 ENCOUNTER — Telehealth (HOSPITAL_COMMUNITY): Payer: Self-pay

## 2018-02-14 NOTE — Telephone Encounter (Signed)
Encounter complete. 

## 2018-02-15 ENCOUNTER — Telehealth (HOSPITAL_COMMUNITY): Payer: Self-pay

## 2018-02-15 NOTE — Telephone Encounter (Signed)
Encounter complete. 

## 2018-02-16 ENCOUNTER — Ambulatory Visit (HOSPITAL_COMMUNITY)
Admission: RE | Admit: 2018-02-16 | Discharge: 2018-02-16 | Disposition: A | Discharge status: Home / Self Care | Payer: Self-pay | Source: Ambulatory Visit | Attending: Cardiology | Admitting: Cardiology

## 2018-02-16 DIAGNOSIS — I2583 Coronary atherosclerosis due to lipid rich plaque: Secondary | ICD-10-CM | POA: Insufficient documentation

## 2018-02-16 DIAGNOSIS — I2510 Atherosclerotic heart disease of native coronary artery without angina pectoris: Secondary | ICD-10-CM | POA: Insufficient documentation

## 2018-02-16 DIAGNOSIS — R079 Chest pain, unspecified: Secondary | ICD-10-CM | POA: Insufficient documentation

## 2018-02-16 LAB — EXERCISE TOLERANCE TEST
CHL CUP MPHR: 160 {beats}/min
CSEPHR: 88 %
CSEPPHR: 142 {beats}/min
Estimated workload: 12.1 METS
Exercise duration (min): 10 min
Exercise duration (sec): 14 s
RPE: 19
Rest HR: 62 {beats}/min

## 2018-03-11 NOTE — Progress Notes (Signed)
Cardiology Office Note   Date:  03/12/2018   ID:  Jeanell Sparrow, DOB 02/26/1956, MRN [id number]  PCP:  Harvest Forest, MD  Cardiologist:  Dr. Tresa Endo   Chief Complaint  Patient presents with  . Coronary Artery Disease  . Chest Pain     History of Present Illness: [name] is a 62 y.o. male who presents for ongoing assessment and management of coronary artery disease, most recent cardiac catheterization in 2015 with a 30% to 50% apparent kinking of the circumflex and RCA and areas.  Other history includes hypertension and hyperlipidemia.  He was seen last in the office on 02/08/2008 for complaints of chest discomfort.  He had stopped taking Zantac due to product recall and a week later began to have recurrence of GERD symptoms, and shortly thereafter began to have stabbing chest pain on the left side with right arm tightness.  The discomfort occurred while at rest, while driving a car but not associated with exertion.  Due to his symptoms a follow-up stress test was ordered for progression of known history of CAD.  Stress test on 02/16/2018 revealed normal blood pressure response, and was negative for ischemia and found to be a low risk study.   He comes today feeling about the same. Still has some gas-like pain and left shoulder discomfort. He is considering seeing his PCP to be checked out for rotator cuff evaluation. He continues to take Pepcid for GERD symptoms.   Past Medical History:  Diagnosis Date  . Hypertension   . Reflux   . 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 His AHI was 0.4; however, his RDI was 8.2. He had an increased arousals. There was no evidence for nocturnal myoclonus.  . S/P cardiac cath 01/29/14    but with area of kinking obstructing the LCX to 50%. other wise patent coronary arteries.  . Sinus bradycardia 2015   Event monitor showed no  critical bradycardia, tolerating low-dose beta blocker  . Spinal stenosis     Past Surgical History:  Procedure Laterality Date  . CHOLECYSTECTOMY    . LEFT HEART CATHETERIZATION WITH CORONARY ANGIOGRAM N/A 01/29/2014   Procedure: LEFT HEART CATHETERIZATION WITH CORONARY ANGIOGRAM;  Surgeon: Lyn Records III, MD; patent coronary arteries  but with area of kinking obstructing the LCX to 50%. EF nl         Current Outpatient Medications  Medication Sig Dispense Refill  . diltiazem (CARDIZEM CD) 240 MG 24 hr capsule Take 240 mg by mouth daily.    Marland Kitchen omeprazole (PRILOSEC) 40 MG capsule Take 1 capsule (40 mg total) by mouth daily. 30 capsule 1  . spironolactone (ALDACTONE) 25 MG tablet TAKE 1 TABLET BY MOUTH DAILY. NEEDS OFFICE VISIT 30 tablet 11   No current facility-administered medications for this visit.     Allergies:   Enalapril; Lyrica [pregabalin]; and Neurontin [gabapentin]    Social History:  The patient  reports that he has never smoked. He has never used smokeless tobacco. He reports that he does not drink alcohol or use drugs.   Family History:  The patient's family history includes Cancer in his sister; Heart failure in his father; Hypertension in his father and mother; Lung cancer in his sister; Pancreatic cancer in his sister.    ROS: All other systems are reviewed and negative. Unless otherwise mentioned in H&P    PHYSICAL EXAM: VS:  BP 128/72  Pulse 74   Ht 6\' 1"  (1.854 m)   Wt 202 lb 3.2 oz (91.7 kg)   BMI 26.68 kg/m  , BMI Body mass index is 26.68 kg/m. GEN: Well nourished, well developed, in no acute distress HEENT: normal Neck: no JVD, carotid bruits, or masses Cardiac: RRR; no murmurs, rubs, or gallops,no edema  Respiratory:  Clear to auscultation bilaterally, normal work of breathing GI: soft, nontender, nondistended, + BS MS: no deformity or atrophy Skin: warm and dry, no rash Neuro:  Strength and sensation are intact Psych: euthymic mood, full  affect   EKG:  Not completed this office visit.   Recent Labs: No results found for requested labs within last 8760 hours.    Lipid Panel    Component Value Date/Time   CHOL 190 06/15/2016 1031   TRIG 136 06/15/2016 1031   HDL 36 (L) 06/15/2016 1031   CHOLHDL 5.3 (H) 06/15/2016 1031   VLDL 27 06/15/2016 1031   LDLCALC 127 (H) 06/15/2016 1031      Wt Readings from Last 3 Encounters:  03/12/18 202 lb 3.2 oz (91.7 kg)  02/07/18 204 lb 9.6 oz (92.8 kg)  07/22/16 212 lb 12.8 oz (96.5 kg)      Other studies Reviewed: NM 02/16/2018 Study Highlights    Blood pressure demonstrated a normal response to exercise.  There was 1mm of J point depression with upsloping ST segment depression at peak exercise.  The patient exercised for 10 min and 14 sec achieving 88% MPHR with good exercise capacity at 12.1 mets. Study stopped due to fatigue and SOB.  Negative ETT for ischemia.  This is a low risk study.       ASSESSMENT AND PLAN:  1.  CAD: Stress test was reassuring not indicating progression of CAD. Will continue secondary management.   2.  Hypertension: BP has excellent control. No changes in his medication regimen.   3. GERD: He is being referred back to Dr. Marvell FullerGessner's office for GI evaluation.    Current medicines are reviewed at length with the patient today.    Labs/ tests ordered today include: None  Bettey MareKathryn M. Liborio NixonLawrence DNP, ANP, [facility]   03/12/2018 10:23 AM    [facility] [address] Office [phone] Fax [phone]

## 2018-03-12 ENCOUNTER — Encounter: Payer: Self-pay | Admitting: Adult Health

## 2018-03-12 ENCOUNTER — Ambulatory Visit (INDEPENDENT_AMBULATORY_CARE_PROVIDER_SITE_OTHER): Payer: Self-pay | Admitting: Adult Health

## 2018-03-12 VITALS — BP 128/72 | HR 74 | Ht 73.0 in | Wt 202.2 lb

## 2018-03-12 DIAGNOSIS — K219 Gastro-esophageal reflux disease without esophagitis: Secondary | ICD-10-CM

## 2018-03-12 DIAGNOSIS — I2510 Atherosclerotic heart disease of native coronary artery without angina pectoris: Secondary | ICD-10-CM

## 2018-03-12 DIAGNOSIS — I10 Essential (primary) hypertension: Secondary | ICD-10-CM

## 2018-03-12 NOTE — Patient Instructions (Signed)
Special Instructions: REFERRAL TO GI   Follow-Up: You will need a follow up appointment in 6 months.  Please call our office 2 months in advance (APRIL) to schedule the [facility](JUNE) appointment.  You may see Nicki Guadalajarahomas Kelly, MD, Joni ReiningKathryn Lawrence, DNP, AACC  or one of the following Advanced Practice Providers on your designated Care Team:   Azalee CourseHao Meng, [place]PA-C -OR- Micah FlesherAngela Duke, PA-C    Medication Instructions:  NO CHANGES- Your physician recommends that you continue on your current medications as directed. Please refer to the Current Medication list given to you today. If you need a refill on your cardiac medications before your next appointment, please call your pharmacy.  Labwork: When you have labs (blood work) and your tests are completely normal, you will receive your results ONLY by MyChart Message (if you have MyChart) -OR- A paper copy in the mail.  At [facility], you and your health needs are our priority.  As part of our continuing mission to provide you with exceptional heart care, we have created designated Provider Care Teams.  These Care Teams include your primary Cardiologist (physician) and Advanced Practice Providers (APPs -  Physician Assistants and Nurse Practitioners) who all work together to provide you with the care you need, when you need it.  Thank you for choosing [facility] at [facility]!!

## 2018-03-27 ENCOUNTER — Telehealth: Payer: Self-pay | Admitting: Internal Medicine

## 2018-03-27 NOTE — Telephone Encounter (Signed)
Hi Dr. Leone PayorGessner, we have received a referral from patient's PCP to evaluate him for GERD. He used to be your patient but [facility]. I spoke with patient and he would like to transfer back to us because our office is closer to his house. [facility] [place]. Would you take this patient back? Please advise. Thank you.

## 2018-03-27 NOTE — Telephone Encounter (Signed)
He can come back but I would suggest we offer an appointment with one of our new MD's who are less busy than me so he can be seen sooner, unless he wants to wait until February or March

## 2018-03-29 ENCOUNTER — Encounter: Payer: Self-pay | Admitting: Internal Medicine

## 2018-03-29 NOTE — Telephone Encounter (Signed)
Pt wanted to wait for Dr. Marvell Fuller first available. He is scheduled for appt on 05/04/18.

## 2018-04-06 ENCOUNTER — Other Ambulatory Visit: Payer: Self-pay | Admitting: Physician Assistant

## 2018-05-04 ENCOUNTER — Ambulatory Visit: Payer: Self-pay | Admitting: Internal Medicine

## 2018-05-04 ENCOUNTER — Encounter: Payer: Self-pay | Admitting: Internal Medicine

## 2018-05-04 ENCOUNTER — Ambulatory Visit: Payer: BLUE CROSS/BLUE SHIELD | Admitting: Internal Medicine

## 2018-05-04 VITALS — BP 114/70 | HR 68 | Ht 72.0 in | Wt 202.4 lb

## 2018-05-04 DIAGNOSIS — R0789 Other chest pain: Secondary | ICD-10-CM

## 2018-05-04 DIAGNOSIS — K219 Gastro-esophageal reflux disease without esophagitis: Secondary | ICD-10-CM

## 2018-05-04 DIAGNOSIS — K5909 Other constipation: Secondary | ICD-10-CM

## 2018-05-04 DIAGNOSIS — R140 Abdominal distension (gaseous): Secondary | ICD-10-CM

## 2018-05-04 NOTE — Patient Instructions (Signed)
You have been given a testing kit to check for small intestine bacterial overgrowth (SIBO) which is completed by a company named [employer]. Make sure to return your test in the mail using the return mailing label given you along with the kit. Your demographic and insurance information have already been sent to the company and they should be in contact with you over the next week regarding this test. Please keep in mind that you will be getting a call from phone number [phone] or a similar number. If you do not hear from them within this time frame, please call our office at [phone].     I appreciate the opportunity to care for you. Carl Gessner, MD, FACG    

## 2018-05-04 NOTE — Progress Notes (Signed)
[name] 62 y.o. [birth year] [id number] Referred by: Harvest Forest, MD  Assessment & Plan:   Encounter Diagnoses  Name Primary?  . Bloating Yes  . Chronic constipation   . Atypical chest pain   . Gastroesophageal reflux disease without esophagitis     Plan to evaluate with a lactulose hydrogen breath test.  If he has small intestinal bacterial overgrowth it may be related to some of his problems.  We will continue a PPI for now.  Given that he has had upper endoscopy more than once in the past, and considering the characteristics of his history and symptoms and signs, I think the utility of a more aggressive gastrointestinal work-up with things like manometry or pH testing would be of low yield.  Furthermore he is out of network with me and the cost of those to him would be exorbitant I suspect.  Once I have the results of his lactulose hydrogen breath test, if positive will treat with antibiotics to see if that improves his symptoms and quality of life.  CC: Harvest Forest, MD Dr. Nicki Guadalajara Subjective:   Chief Complaint: Belching bloating and epigastric and chest pain  HPI The patient is a 62 year old African-American man seen by me years ago, then seen at [facility] for GERD-like symptoms.  When I saw him last it was in 2009 and 2010, he was found to have fundic gland polyps on an EGD and gallstones on ultrasound had a cholecystectomy and had limited improvement and then had recurrent problems with pains in his chest after he eats.  He complains of constipation, but tends to move his bowels most days.  He will use an herbal laxative tea every few weeks.  He was having some chest pain issues he went to Dr. Tresa Endo and his team and had a stress test that was negative.  He was on Zantac and had quit that because of the cancer risk association, they changed him to omeprazole and he thinks he is better but still has these fleeting intermittent postprandial chest symptoms.  He  had C-spine surgery at [facility] in 2017 was having some dysphagia after that and that is largely better, though rare occasional dysphagia.  He got referred to Dr. Chevis Pretty at [facility] he had an EGD and a colonoscopy in 2017.  Moderate chronic gastritis on EGD biopsies, colonoscopy was clear of polyps.  He also had abnormal transaminases at that time, I wonder if it was not related to his surgery, his work-up was all negative and that resolved.  He had a negative CT the abdomen and pelvis as part of a work-up for weight loss at that time. Allergies  Allergen Reactions  . Enalapril Cough  . Lyrica [Pregabalin] Palpitations  . Neurontin [Gabapentin] Palpitations   Current Meds  Medication Sig  . diltiazem (CARDIZEM CD) 240 MG 24 hr capsule Take 240 mg by mouth daily.  Marland Kitchen omeprazole (PRILOSEC) 40 MG capsule TAKE ONE CAPSULE BY MOUTH DAILY  . spironolactone (ALDACTONE) 25 MG tablet TAKE 1 TABLET BY MOUTH DAILY. NEEDS OFFICE VISIT   Past Medical History:  Diagnosis Date  . Gallstones   . Gastric polyps   . GERD (gastroesophageal reflux disease)   . HLD (hyperlipidemia)   . Hypertension   . Hyperthyroidism   . 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 His AHI was 0.4; however, his RDI was 8.2. He had an increased arousals.  There was no evidence for nocturnal myoclonus.  . S/P cardiac cath 01/29/14    but with area of kinking obstructing the LCX to 50%. other wise patent coronary arteries.  . Sinus bradycardia 2015   Event monitor showed no critical bradycardia, tolerating low-dose beta blocker  . Spinal stenosis    Past Surgical History:  Procedure Laterality Date  . CHOLECYSTECTOMY    . LEFT HEART CATHETERIZATION WITH CORONARY ANGIOGRAM N/A 01/29/2014   Procedure: LEFT HEART CATHETERIZATION WITH CORONARY ANGIOGRAM;  Surgeon: Lyn Records III, MD; patent coronary arteries  but with  area of kinking obstructing the LCX to 50%. EF nl      . NECK SURGERY Left    titanium plate with screws   Social History   Social History Narrative   He is married he has 3 sons 2 daughters   He works as a Nurse, adult for [employer], he is a retired Emergency planning/management officer   No alcohol tobacco or drug use   family history includes Diabetes in his mother; Heart Problems in his sister; Heart failure in his father; Hypertension in his father; Lung cancer in his sister; Pancreatic cancer in his father; Stroke in his brother.   Review of Systems He has nocturia and urinary frequency.  Some persistent neck pain.  Sleeping difficulty.  All other review of systems are negative or per HPI.  Objective:   Physical Exam @BP  114/70 (BP Location: Left Arm, Patient Position: Sitting, Cuff Size: Normal)   Pulse 68   Ht 6' (1.829 m) Comment: height measured without shoes  Wt 202 lb 6 oz (91.8 kg)   BMI 27.45 kg/m @  General:  Well-developed, well-nourished and in no acute distress Eyes:  anicteric. ENT:   Mouth and posterior pharynx free of lesions.  Neck:   supple w/o thyromegaly or mass.  Lungs: Clear to auscultation bilaterally. Heart:  S1S2, no rubs, murmurs, gallops. Abdomen:  soft, non-tender, no hepatosplenomegaly, hernia, or mass and BS+.  Rectal:   Anoderm inspection revealed no abnormalities Anal wink was positive Digital exam revealed normal resting tone and voluntary squeeze. No mass or rectocele present. Simulated defecation with valsalva revealed appropriate abdominal contraction and descent.    Lymph:  no cervical or supraclavicular adenopathy. Extremities:   no edema, cyanosis or clubbing Skin   no rash. Neuro:  A&O x 3.  Psych:  appropriate mood and  Affect.   Data Reviewed:  See HPI

## 2018-05-05 ENCOUNTER — Other Ambulatory Visit: Payer: Self-pay | Admitting: Adult Health

## 2018-06-03 ENCOUNTER — Other Ambulatory Visit: Payer: Self-pay | Admitting: Adult Health

## 2018-10-02 ENCOUNTER — Telehealth: Payer: Self-pay

## 2018-10-02 NOTE — Telephone Encounter (Signed)
Left message for patient to call back  

## 2018-10-02 NOTE — Telephone Encounter (Signed)
-----   Message from Gatha Mayer, MD sent at 10/01/2018  6:05 PM EDT ----- Regarding: neg breath test Please advise patient SIBO test is negative  Get an update on GI sxs and his need for further eval/Tx please  CEG

## 2018-10-02 NOTE — Telephone Encounter (Signed)
Patient reports he is doing well except for constipation.  He is using a herbal tea and dulcolax.    He was notified of the negative results.

## 2018-10-03 ENCOUNTER — Other Ambulatory Visit: Payer: Self-pay | Admitting: Cardiovascular Disease

## 2018-10-03 MED ORDER — DILTIAZEM HCL ER COATED BEADS 240 MG PO CP24: 240.0000 mg | ORAL_CAPSULE | Freq: Every day | ORAL | 1 refills | Status: AC

## 2019-02-19 ENCOUNTER — Other Ambulatory Visit: Payer: Self-pay | Admitting: Cardiovascular Disease

## 2019-02-19 NOTE — Telephone Encounter (Signed)
Rx request sent to pharmacy.  

## 2019-05-10 ENCOUNTER — Telehealth: Payer: Self-pay | Admitting: Cardiovascular Disease

## 2019-05-10 NOTE — Telephone Encounter (Signed)
Spoke with patient. Patient reports having palpitations on and off but last night he developed a stabbing chest pain around his heart. Patient reports experiencing sweating and shortness of breath. Patient also reports some "gas pain."  During phone conversation patient reported still experiencing a heaviness on his chest. He reports the pain did extend to his left arm. He reports he is still feeling "gas pains."   Advised patient to call 911 or go to the nearest emergency room for evaluation. Patient not wanting to go to hospital. Nurse advised again to seek medical attention.   Nurse then spoke to a second nurse who also advised patient to go to the ED. Patient refused.   Nurse went to DOD Dr. Cristal Deer who requested patient seek medical advise at the emergency room. Patient informed of recommendation and refuses at this time.   He states if it gets worse he will go. Patient requested a follow up appointment for next week with any one in the office so that if he does go to the ER he will already have a follow up appointment. Patient slotted for 2/19 at 8:15am with Oris Drone. Nurse once again advised patient seek medical attention.

## 2019-05-10 NOTE — Telephone Encounter (Signed)
Patient c/o Palpitations:  High priority if patient c/o lightheadedness, shortness of breath, or chest pain  1) How long have you had palpitations/irregular HR/ Afib? Are you having the symptoms now? A few weeks ago, but last night was the wrose.  2) Are you currently experiencing lightheadedness, SOB or CP? Not right now  3) Do you have a history of afib (atrial fibrillation) or irregular heart rhythm? Yes   4) Have you checked your BP or HR? (document readings if available) yes forgot what it was  5) Are you experiencing any other symptoms? stabbing on both sides of his chest.

## 2019-05-10 NOTE — Telephone Encounter (Signed)
Discussed with Dr.Kelly and per MD, he agree's with Dr. Di Kindle advice that pt should go to the hospital.

## 2019-05-17 ENCOUNTER — Ambulatory Visit: Payer: BLUE CROSS/BLUE SHIELD | Admitting: General Practice

## 2020-01-21 ENCOUNTER — Ambulatory Visit: Payer: BLUE CROSS/BLUE SHIELD | Admitting: Cardiovascular Disease

## 2020-01-27 ENCOUNTER — Ambulatory Visit (INDEPENDENT_AMBULATORY_CARE_PROVIDER_SITE_OTHER): Payer: PRIVATE HEALTH INSURANCE | Admitting: Cardiovascular Disease

## 2020-01-27 ENCOUNTER — Other Ambulatory Visit: Payer: Self-pay

## 2020-01-27 ENCOUNTER — Encounter: Payer: Self-pay | Admitting: Cardiovascular Disease

## 2020-01-27 VITALS — BP 140/80 | HR 56 | Ht 73.0 in | Wt 205.8 lb

## 2020-01-27 DIAGNOSIS — I2510 Atherosclerotic heart disease of native coronary artery without angina pectoris: Secondary | ICD-10-CM | POA: Diagnosis not present

## 2020-01-27 DIAGNOSIS — I10 Essential (primary) hypertension: Secondary | ICD-10-CM | POA: Diagnosis not present

## 2020-01-27 DIAGNOSIS — K219 Gastro-esophageal reflux disease without esophagitis: Secondary | ICD-10-CM

## 2020-01-27 DIAGNOSIS — R0789 Other chest pain: Secondary | ICD-10-CM | POA: Diagnosis not present

## 2020-01-27 DIAGNOSIS — E785 Hyperlipidemia, unspecified: Secondary | ICD-10-CM

## 2020-01-27 MED ORDER — LOSARTAN POTASSIUM 25 MG PO TABS: 25.0000 mg | ORAL_TABLET | Freq: Every day | ORAL | 3 refills | Status: DC

## 2020-01-27 NOTE — Progress Notes (Signed)
Patient ID: [name], male   DOB: 03-10-1956, 64 y.o.   MRN: [id number]     HPI: [name] is a 64 y.o. male who presents to the office today for a  follow-up cardiology evaluation.  I last saw him in April 2018.  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  I had seen him in [facility] emergency room when he presented on 01/29/2014 after being awakened from a deep sleep with substernal chest pressure associated with diaphoresis and mild shortness of breath.  He has a history of accelerated hypertension and previously  had blood pressures in excess of 200 bpm.  His ECG on presentation did not reveal acute ST segment changes.  He continue to experience residual episodes of chest pain and cardiology consultation was recommended.  His cardiac risk factors were notable for long-standing history of hypertension as well as family history for heart disease.  I recommended definitive cardiac catheterization.  Catheterization was done by Dr. Tamala Julian and both the right and circumflex coronary arteries contained a region of kinking that in one view appeared to obstructive vessel up to 50%.  Normal LV function.  Ejection fraction was 55-60%.  He was seen in follow-up of his hospitalization by Cecilie Kicks on 03/05/2014.  He did not experience recurrent chest pain.  He had been on a PPI.  He had had some bradycardia in the hospital and a subsequent event monitor did not reveal significant bradycardia arrhythmia and he was restarted back on metoprolol at 25 mg twice a day.  Due to concerns for sleep apnea particularly with his chest pain awakening him from sleep he underwent a sleep study on November 12 23 2015.  This suggested increased upper airway resistance syndrome but definitive sleep apnea was not demonstrated.  His AHI was 0.4; however, his RDI was 8.2.  He had an increased arousals.  There was no evidence for nocturnal myoclonus.  When I last saw him last year his blood pressure was  mildly increased at 148/88. Since he did have some issues with erectile dysfunction on metoprolol  I changed him to [facility] and recommended initiation of 5 mg daily. An echo Doppler study  on 08/07/2014  showed an ejection fraction of 40-98%, grade 2 diastolic dysfunction, trivial aortic insufficiency and mild dilatation of his aortic root at 44 mm.  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  When I saw him, I reviewed his laboratory and his lipid studies revealed a cholesterol 183 LDL cholesterol 120, HDL 42 and triglycerides 103.  He was started on atorvastatin 20 mg.  He was seen by Rosaria Ferries and was started on spironolactone for improved blood pressure control.  Inside last saw him January 2017, he underwent cervical C3-7 spine surgery at [facility] on 09/10/2015 by Dr. Pamala Hurry.  The patient tells me that he had episode of syncope following surgery.  During his evaluation he was also found to have thyroid nodule and underwent ENT evaluation at [facility].  He also had undergone  endoscopy.  Most recently he has been on Bystolic 2.5 mg, diltiazem 240 mg, spironolactone 25 mg for hypertension.  He has been taking ranitidine 150 mg for GERD in addition to tramadol as needed results for evaluation.  When I  saw him in March 2018, he was bradycardic and complained of mild dizziness if he stood abruptly.  I recommended that he wean and ultimately discontinue his low dose Bystolic,  which he successfully has done over 10 day period.   I last saw him in follow-up in April 2018.  Laboratory which showed a TSH of 0.97.  Hemoglobin and hematocrit were stable.  Electrolytes were normal.  He had mild renal insufficiency with a creatinine at 1.38 which had increased from June 2017 at 0.99.  LFTs were normal.  Lipid studies were notable for cholesterol of 190, triglycerides 136, HDL 36, and LDL 127.  Mildly, he had had previous significant LFT elevation in June 2017 and his ALT was 301  and AST 108.  These were now entirely normal.  During that evaluation his blood pressure was stable on diltiazem 240 mg and spironolactone 25 mg daily.  Since I last saw him he has been seen on several occasions by Bunnie Domino with her last evaluation in December 2019.  He had undergone a stress test on February 16, 2018 which was negative for ischemia and he had a normal blood pressure response.  He was having some intermittent gas-like pain and left shoulder discomfort.  He was on Pepcid for GER symptoms.  Since his last evaluation at [facility], he has been evaluated in [facility] cardiology which is part of the [facility] system.  He had experienced some atypical chest pain and was evaluated by Glenis Smoker, Carmelia Roller, DNP,APRN.  His pain was described as sharp and was not felt to be ischemic in etiology.  A murmur was noted on exam and as result he underwent an echo Doppler study on Aug 15, 2019 which showed an EF of 60 to 65% without segmental wall motion abnormality.  Atrial size was normal.  He had normal valves.  Presently, Mr. Schweigert continues to note some atypical stabbing left-sided chest shooting discomfort which usually last seconds.  He notes this after activity.  He continues to work at [employer], learning disability.  He denies any exertional chest tightness.  His blood pressure has been somewhat elevated with typical systolics in the 027 range.  He has not had recent fasting laboratory.  He continues to be on diltiazem 240 mg daily for hypertension, rosuvastatin 40 mg for hyperlipidemia, pantoprazole 40 mg for Protonix and methimazole 5 mg for hyperthyroidism.  He presents for evaluation.  Past Medical History:  Diagnosis Date  . Gallstones   . Gastric polyps   . GERD (gastroesophageal reflux disease)   . HLD (hyperlipidemia)   . Hypertension   . Hyperthyroidism   . 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 His AHI was 0.4; however, his RDI was 8.2. He had an increased arousals. There was no evidence for nocturnal myoclonus.  . S/P cardiac cath 01/29/14    but with area of kinking obstructing the LCX to 50%. other wise patent coronary arteries.  . Sinus bradycardia 2015   Event monitor showed no critical bradycardia, tolerating low-dose beta blocker  . Spinal stenosis     Past Surgical History:  Procedure Laterality Date  . CHOLECYSTECTOMY    . LEFT HEART CATHETERIZATION WITH CORONARY ANGIOGRAM N/A 01/29/2014   Procedure: LEFT HEART CATHETERIZATION WITH CORONARY ANGIOGRAM;  Surgeon: Belva Crome III, MD; patent coronary arteries  but with area of kinking obstructing the LCX to 50%. EF nl      . NECK SURGERY Left    titanium plate with screws    Allergies  Allergen Reactions  . Atorvastatin Other (See Comments) and Nausea And  Vomiting    States he had to go seek medical care secondary to degree of Abd discomfort    . Enalapril Cough  . Lyrica [Pregabalin] Palpitations  . Neurontin [Gabapentin] Palpitations  . Prednisone Nausea Only    Current Outpatient Medications  Medication Sig Dispense Refill  . aspirin 81 MG EC tablet Take by mouth.    . diltiazem (CARDIZEM CD) 240 MG 24 hr capsule Take 1 capsule (240 mg total) by mouth daily. 90 capsule 1  . ferrous sulfate 325 (65 FE) MG tablet Take by mouth.    . loratadine (CLARITIN) 10 MG tablet Take by mouth.    . methimazole (TAPAZOLE) 5 MG tablet Take by mouth.    Marland Kitchen oxymetazoline (AFRIN NASAL SPRAY) 0.05 % nasal spray Afrin (oxymetazoline) 0.05 % nasal spray  2 sprays in left nostril today, then as needed 2x a day if nose bleed.    . pantoprazole (PROTONIX) 40 MG tablet Take by mouth.    . rosuvastatin (CRESTOR) 40 MG tablet Take by mouth.    . losartan (COZAAR) 25 MG tablet Take 1 tablet (25 mg total) by mouth daily. 90 tablet 3   No current facility-administered medications for this visit.     Social History   Socioeconomic History  . Marital status: Married    Spouse name: Not on file  . Number of children: 2  . Years of education: Not on file  . Highest education level: Not on file  Occupational History  . Occupation: retired Engineer, structural  . Occupation: [employer]  Tobacco Use  . Smoking status: Never Smoker  . Smokeless tobacco: Never Used  Substance and Sexual Activity  . Alcohol use: No  . Drug use: No  . Sexual activity: Yes  Other Topics Concern  . Not on file  Social History Narrative   He is married he has 3 sons 2 daughters   He works as a Training and development officer for [employer], he is a retired Engineer, structural   No alcohol tobacco or drug use   Social Determinants of Radio broadcast assistant Strain:   . Difficulty of Paying Living Expenses: Not on file  Food Insecurity:   . Worried About Charity fundraiser in the Last Year: Not on file  . Ran Out of Food in the Last Year: Not on file  Transportation Needs:   . Lack of Transportation (Medical): Not on file  . Lack of Transportation (Non-Medical): Not on file  Physical Activity:   . Days of Exercise per Week: Not on file  . Minutes of Exercise per Session: Not on file  Stress:   . Feeling of Stress : Not on file  Social Connections:   . Frequency of Communication with Friends and Family: Not on file  . Frequency of Social Gatherings with Friends and Family: Not on file  . Attends Religious Services: Not on file  . Active Member of Clubs or Organizations: Not on file  . Attends Archivist Meetings: Not on file  . Marital Status: Not on file  Intimate Partner Violence:   . Fear of Current or Ex-Partner: Not on file  . Emotionally Abused: Not on file  . Physically Abused: Not on file  . Sexually Abused: Not on file   Social history is notable that he is married, has 5 children.  He is retired Engineer, structural in [place], [place] for 28 years.  He now lives in  [place].  There is no tobacco use.  Family History  Problem Relation Age of Onset  . Heart Problems Sister   . Heart failure Father   . Hypertension Father   . Pancreatic cancer Father   . Lung cancer Sister   . Diabetes Mother   . Stroke Brother    Family history is also notable that his mother is still living at age 61.  Father died at age 87 and had undergone CABG surgery at 48.  He has 5 siblings and several sisters who have had SVT.  One sister was recently diagnosed with adrenal gland issues.  ROS General: Negative; No fevers, chills, or night sweats HEENT: Negative; No changes in vision or hearing, sinus congestion, difficulty swallowing Pulmonary: Negative; No cough, wheezing, shortness of breath, hemoptysis Cardiovascular: See HPI:  GI: Positive for GERD GU: Negative; No dysuria, hematuria, or difficulty voiding Musculoskeletal: Negative; no myalgias, joint pain, or weakness Hematologic: Negative; no easy bruising, bleeding Endocrine: Positive for hyperthyroidism on Tapazole Neuro: Negative; no changes in balance, headaches Skin: Negative; No rashes or skin lesions Psychiatric: Negative; No behavioral problems, depression Sleep: Negative; No snoring,  daytime sleepiness, hypersomnolence, bruxism, restless legs, hypnogognic hallucinations. Other comprehensive 14 point system review is negative   Physical Exam BP 140/80   Pulse (!) 56   Ht _0  (1.854 m)   Wt 205 lb 12.8 oz (93.4 kg)   BMI 27.15 kg/m    Repeat blood pressure by me was 142/82  Wt Readings from Last 3 Encounters:  01/27/20 205 lb 12.8 oz (93.4 kg)  05/04/18 202 lb 6 oz (91.8 kg)  03/12/18 202 lb 3.2 oz (91.7 kg)   General: Alert, oriented, no distress.  Skin: normal turgor, no rashes, warm and dry HEENT: Normocephalic, atraumatic. Pupils equal round and reactive to light; sclera anicteric; extraocular muscles intact;  Nose without nasal septal hypertrophy Mouth/Parynx benign; Mallinpatti  scale 3 Neck: No JVD, no carotid bruits; normal carotid upstroke Lungs: clear to ausculatation and percussion; no wheezing or rales Chest wall: without tenderness to palpitation Heart: PMI not displaced, RRR, s1 s2 normal, 1/6 systolic murmur, no diastolic murmur, no rubs, gallops, thrills, or heaves Abdomen: soft, nontender; no hepatosplenomehaly, BS+; abdominal aorta nontender and not dilated by palpation. Back: no CVA tenderness Pulses 2+ Musculoskeletal: full range of motion, normal strength, no joint deformities Extremities: no clubbing cyanosis or edema, Homan's sign negative  Neurologic: grossly nonfocal; Cranial nerves grossly wnl Psychologic: Normal mood and affect   ECG (independently read by me): Sinu bradycardia at 56; 1st degree block; PR 218 msec, no ectopy  July 22, 2016 ECG (independently read by me): Normal sinus rhythm at 63 bpm.  Borderline first-degree AV block with a PR interval of 204 ms.  No ST segment changes  March 2018 ECG (independently read by me): Sinus bradycardia 53 bpm.  Normal intervals.  No ST segment changes.  January 2017 ECG (independently read by me): Sinus bradycardia 55 bpm.  Borderline first-degree AV block with a PR interval at 204 ms.  No ST-T changes  July 2016 ECG (independently read by me):  Normal sinus rhythm at 63 bpm.  Borderline first-degree AV block with a PR interval at 206 ms.  ECG (independently read by me): Normal sinus rhythm at 68 bpm.  Nonspecific ST changes in lead 3.  QTc interval 414 ms.  LABS:  BMP Latest Ref Rng & Units 01/29/2020 06/15/2016 09/16/2015  Glucose 65 - 99 mg/dL 84 89 159(H)  BUN 8 -  27 mg/dL _0 Creatinine 0.76 - 1.27 mg/dL 1.02 1.38(H) 0.99  BUN/Creat Ratio 10 - 24 12 - -  Sodium 134 - 144 mmol/L 143 141 135  Potassium 3.5 - 5.2 mmol/L 4.1 4.0 4.1  Chloride 96 - 106 mmol/L 104 105 99(L)  CO2 20 - 29 mmol/L _1 Calcium 8.6 - 10.2 mg/dL 9.8 9.0 9.5     Hepatic Function Latest Ref Rng &  Units 01/29/2020 06/15/2016 09/16/2015  Total Protein 6.0 - 8.5 g/dL 7.3 6.7 7.0  Albumin 3.8 - 4.8 g/dL 4.5 4.0 3.4(L)  AST 0 - 40 IU/L 28 17 42(H)  ALT 0 - 44 IU/L 25 14 230(H)  Alk Phosphatase 44 - 121 IU/L 113 71 116  Total Bilirubin 0.0 - 1.2 mg/dL 0.4 0.7 0.8  Bilirubin, Direct 0.0 - 0.3 mg/dL - - -    CBC Latest Ref Rng & Units 01/29/2020 06/15/2016 09/15/2015  WBC 3.4 - 10.8 x10E3/uL 5.6 6.2 12.0(H)  Hemoglobin 13.0 - 17.7 g/dL 14.0 14.0 13.8  Hematocrit 37.5 - 51.0 % 42.6 42.0 42.4  Platelets 150 - 450 x10E3/uL 214 265 223   Lab Results  Component Value Date   MCV 85 01/29/2020   MCV 88.2 06/15/2016   MCV 87.8 09/15/2015    BNP No results found for: BNP  ProBNP    Component Value Date/Time   PROBNP <30.0 04/16/2007 0253     Lipid Panel     Component Value Date/Time   CHOL 115 01/29/2020 0940   TRIG 61 01/29/2020 0940   HDL 46 01/29/2020 0940   CHOLHDL 2.5 01/29/2020 0940   CHOLHDL 5.3 (H) 06/15/2016 1031   VLDL 27 06/15/2016 1031   LDLCALC 56 01/29/2020 0940     RADIOLOGY: No results found.  IMPRESSION:  1. Coronary artery disease due to lipid rich plaque   2. Essential hypertension   3. Hyperlipidemia LDL goal <70   4. Elevated liver function tests ; resolved  5. Sinus bradycardia ; resolved    ASSESSMENT AND PLAN: Mr. 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  In November 2015 he was awakened with substernal chest tightness associated with diaphoresis and shortness of breath.  Cardiac catheterization revealed 30-50% areas of apparent kinking in the circumflex and RCA.   He had been on amlodipine as well as beta blocker therapy since his catheterization.  Amlodipine was discontinued by his primary physician due to concerns of burning and tingling in his chest. I'm not certain that this was related to  amlodipine.  His erectile dysfunction  improved with changing from metoprolol to Bystolic.  He developed significant cervical spine disease and underwent surgery at [facility] in June 2017 involving C3-C7 and had significant elevation of liver function studies.  He was told that this may be due to fatty liver.  LFTs normalized.  He had undergone a normal exercise treadmill test in November 2019 which was low risk and did not reveal any ischemic changes.  He has had continued episodes of somewhat atypical chest pain which is not felt to be ischemic mediated.  He had recently been evaluated in [facility] and an echo Doppler study showed normal systolic function.  There was no mention of diastolic function.  He had normal valvular architecture.  His blood pressure today is mildly elevated and  I reviewed with him the new hypertensive guidelines of 2017.  I have recommended the addition of losartan to start at 25 mg to his current dose of long-acting diltiazem 240 mg daily.  Optimal blood pressure is less than 120/80 and I discussed with him for every 20/10 increase there is a potential doubling of cardiovascular risk.  I am recommending laboratory be obtained in the fasting state.  He continues to be on rosuvastatin for hyperlipidemia with target LDL less than 70.  Of note in March 2018 LDL cholesterol was 127.  I will contact him regarding laboratory results and plan to see him in 3 months for reevaluation.   Troy Sine, MD, [facility]  02/03/2020 1:04 PM

## 2020-01-27 NOTE — Patient Instructions (Signed)
Medication Instructions:  START Losartan 25 mg daily   *If you need a refill on your cardiac medications before your next appointment, please call your pharmacy*   Lab Work: Please return for FASTING labs (CMET, CBC, Lipid)  Our in office lab hours are Monday-Friday 8:00-4:00, closed for lunch 12:45-1:45 pm.  No appointment needed.  If you have labs (blood work) drawn today and your tests are completely normal, you will receive your results only by: Marland Kitchen MyChart Message (if you have MyChart) OR . A paper copy in the mail If you have any lab test that is abnormal or we need to change your treatment, we will call you to review the results.  Follow-Up: At [facility], you and your health needs are our priority.  As part of our continuing mission to provide you with exceptional heart care, we have created designated Provider Care Teams.  These Care Teams include your primary Cardiologist (physician) and Advanced Practice Providers (APPs -  Physician Assistants and Nurse Practitioners) who all work together to provide you with the care you need, when you need it.  We recommend signing up for the patient portal called "MyChart".  Sign up information is provided on this After Visit Summary.  MyChart is used to connect with patients for Virtual Visits (Telemedicine).  Patients are able to view lab/test results, encounter notes, upcoming appointments, etc.  Non-urgent messages can be sent to your provider as well.   To learn more about what you can do with MyChart, go to [email or web address].    Your next appointment:   3 month(s)  The format for your next appointment:   In Person  Provider:   Nicki Guadalajara, MD

## 2020-01-28 ENCOUNTER — Telehealth: Payer: Self-pay | Admitting: Cardiovascular Disease

## 2020-01-28 MED ORDER — LOSARTAN POTASSIUM 25 MG PO TABS: 25.0000 mg | ORAL_TABLET | Freq: Every day | ORAL | 3 refills | Status: DC

## 2020-01-28 NOTE — Telephone Encounter (Signed)
Losartan 25 mg once daily has been sent to the pharmacy requested.

## 2020-01-28 NOTE — Telephone Encounter (Signed)
Pt c/o medication issue:  1. Name of Medication: losartan (COZAAR) 25 MG tablet  2. How are you currently taking this medication (dosage and times per day)? N/A  3. Are you having a reaction (difficulty breathing--STAT)? No  4. What is your medication issue? Patient states the medication was sent to the incorrect pharmacy. He is requesting to have Rx sent to [facility] - [address] [place], [place], [place] [address]. Please assist.

## 2020-01-29 LAB — COMPREHENSIVE METABOLIC PANEL
ALT: 25 IU/L (ref 0–44)
AST: 28 IU/L (ref 0–40)
Albumin/Globulin Ratio: 1.6 (ref 1.2–2.2)
Albumin: 4.5 g/dL (ref 3.8–4.8)
Alkaline Phosphatase: 113 IU/L (ref 44–121)
BUN/Creatinine Ratio: 12 (ref 10–24)
BUN: 12 mg/dL (ref 8–27)
Bilirubin Total: 0.4 mg/dL (ref 0.0–1.2)
CO2: 29 mmol/L (ref 20–29)
Calcium: 9.8 mg/dL (ref 8.6–10.2)
Chloride: 104 mmol/L (ref 96–106)
Creatinine, Ser: 1.02 mg/dL (ref 0.76–1.27)
GFR calc Af Amer: 91 mL/min/{1.73_m2} (ref >59)
GFR calc non Af Amer: 78 mL/min/{1.73_m2} (ref >59)
Globulin, Total: 2.8 g/dL (ref 1.5–4.5)
Glucose: 84 mg/dL (ref 65–99)
Potassium: 4.1 mmol/L (ref 3.5–5.2)
Sodium: 143 mmol/L (ref 134–144)
Total Protein: 7.3 g/dL (ref 6.0–8.5)

## 2020-01-29 LAB — LIPID PANEL
Chol/HDL Ratio: 2.5 ratio (ref 0.0–5.0)
Cholesterol, Total: 115 mg/dL (ref 100–199)
HDL: 46 mg/dL (ref >39)
LDL Chol Calc (NIH): 56 mg/dL (ref 0–99)
Triglycerides: 61 mg/dL (ref 0–149)
VLDL Cholesterol Cal: 13 mg/dL (ref 5–40)

## 2020-01-29 LAB — CBC
Hematocrit: 42.6 % (ref 37.5–51.0)
Hemoglobin: 14 g/dL (ref 13.0–17.7)
MCH: 27.8 pg (ref 26.6–33.0)
MCHC: 32.9 g/dL (ref 31.5–35.7)
MCV: 85 fL (ref 79–97)
Platelets: 214 10*3/uL (ref 150–450)
RBC: 5.03 x10E6/uL (ref 4.14–5.80)
RDW: 13.9 % (ref 11.6–15.4)
WBC: 5.6 10*3/uL (ref 3.4–10.8)

## 2020-02-03 ENCOUNTER — Encounter: Payer: Self-pay | Admitting: Cardiovascular Disease

## 2020-05-06 ENCOUNTER — Other Ambulatory Visit: Payer: Self-pay | Admitting: Physician Assistant

## 2020-05-06 DIAGNOSIS — M5412 Radiculopathy, cervical region: Secondary | ICD-10-CM

## 2020-05-06 DIAGNOSIS — Z9889 Other specified postprocedural states: Secondary | ICD-10-CM

## 2020-05-21 ENCOUNTER — Encounter: Payer: Self-pay | Admitting: Cardiovascular Disease

## 2020-05-21 ENCOUNTER — Ambulatory Visit (INDEPENDENT_AMBULATORY_CARE_PROVIDER_SITE_OTHER): Payer: BLUE CROSS/BLUE SHIELD | Admitting: Cardiovascular Disease

## 2020-05-21 ENCOUNTER — Other Ambulatory Visit: Payer: Self-pay

## 2020-05-21 DIAGNOSIS — I10 Essential (primary) hypertension: Secondary | ICD-10-CM | POA: Diagnosis not present

## 2020-05-21 DIAGNOSIS — R0789 Other chest pain: Secondary | ICD-10-CM

## 2020-05-21 DIAGNOSIS — I2510 Atherosclerotic heart disease of native coronary artery without angina pectoris: Secondary | ICD-10-CM | POA: Diagnosis not present

## 2020-05-21 DIAGNOSIS — E785 Hyperlipidemia, unspecified: Secondary | ICD-10-CM | POA: Diagnosis not present

## 2020-05-21 MED ORDER — LOSARTAN POTASSIUM 50 MG PO TABS
50.0000 mg | ORAL_TABLET | Freq: Every day | ORAL | 3 refills | Status: DC
Start: 2020-05-21 — End: 2021-01-04

## 2020-05-21 NOTE — Progress Notes (Signed)
Patient ID: [name], male   DOB: 07-26-1955, 65 y.o.   MRN: [id number]     HPI: [name] is a 65 y.o. male who presents to the office today for a  4 month follow-up cardiology evaluation.   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  I had seen him in [facility] emergency room when he presented on 01/29/2014 after being awakened from a deep sleep with substernal chest pressure associated with diaphoresis and mild shortness of breath.  He has a history of accelerated hypertension and previously  had blood pressures in excess of 200 bpm.  His ECG on presentation did not reveal acute ST segment changes.  He continue to experience residual episodes of chest pain and cardiology consultation was recommended.  His cardiac risk factors were notable for long-standing history of hypertension as well as family history for heart disease.  I recommended definitive cardiac catheterization.  Catheterization was done by Dr. Katrinka Blazing and both the right and circumflex coronary arteries contained a region of kinking that in one view appeared to obstructive vessel up to 50%.  Normal LV function.  Ejection fraction was 55-60%.  He was seen in follow-up of his hospitalization by [name] on 03/05/2014.  He did not experience recurrent chest pain.  He had been on a PPI.  He had had some bradycardia in the hospital and a subsequent event monitor did not reveal significant bradycardia arrhythmia and he was restarted back on metoprolol at 25 mg twice a day.  Due to concerns for sleep apnea particularly with his chest pain awakening him from sleep he underwent a sleep study on November 12 23 2015.  This suggested increased upper airway resistance syndrome but definitive sleep apnea was not demonstrated.  His AHI was 0.4; however, his RDI was 8.2.  He had an increased arousals.  There was no evidence for nocturnal myoclonus.  When I last saw him last year his blood pressure was mildly increased at  148/88. Since he did have some issues with erectile dysfunction on metoprolol  I changed him to [facility] and recommended initiation of 5 mg daily. An echo Doppler study  on 08/07/2014  showed an ejection fraction of 55-60%, grade 2 diastolic dysfunction, trivial aortic insufficiency and mild dilatation of his aortic root at 44 mm.  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  When I saw him, I reviewed his laboratory and his lipid studies revealed a cholesterol 183 LDL cholesterol 120, HDL 42 and triglycerides 103.  He was started on atorvastatin 20 mg.  He was seen by [name] and was started on spironolactone for improved blood pressure control.  Inside last saw him January 2017, he underwent cervical C3-7 spine surgery at [facility] on 09/10/2015 by Dr. Rise Mu.  The patient tells me that he had episode of syncope following surgery.  During his evaluation he was also found to have thyroid nodule and underwent ENT evaluation at [facility].  He also had undergone  endoscopy.  Most recently he has been on Bystolic 2.5 mg, diltiazem 240 mg, spironolactone 25 mg for hypertension.  He has been taking ranitidine 150 mg for GERD in addition to tramadol as needed results for evaluation.  When I  saw him in March 2018, he was bradycardic and complained of mild dizziness if he stood abruptly.  I recommended that he wean and ultimately discontinue his low dose Bystolic, which he successfully has done  over 10 day period.   I last saw him in follow-up in April 2018.  Laboratory which showed a TSH of 0.97.  Hemoglobin and hematocrit were stable.  Electrolytes were normal.  He had mild renal insufficiency with a creatinine at 1.38 which had increased from June 2017 at 0.99.  LFTs were normal.  Lipid studies were notable for cholesterol of 190, triglycerides 136, HDL 36, and LDL 127.  Mildly, he had had previous significant LFT elevation in June 2017 and his ALT was 301 and AST 108.  These  were now entirely normal.  During that evaluation his blood pressure was stable on diltiazem 240 mg and spironolactone 25 mg daily.  Since I last saw him he has been seen on several occasions by [name] with her last evaluation in December 2019.  He had undergone a stress test on February 16, 2018 which was negative for ischemia and he had a normal blood pressure response.  He was having some intermittent gas-like pain and left shoulder discomfort.  He was on Pepcid for GER symptoms.  Since his last evaluation at [facility], he has been evaluated in [facility] cardiology which is part of the [facility] system.  He had experienced some atypical chest pain and was evaluated by [name], Jason Fester, DNP,APRN.  His pain was described as sharp and was not felt to be ischemic in etiology.  A murmur was noted on exam and as result he underwent an echo Doppler study on Aug 15, 2019 which showed an EF of 60 to 65% without segmental wall motion abnormality.  Atrial size was normal.  He had normal valves.  I last saw him on January 27, 2020 at which time he continued to feel well but experienced intermittent  atypical stabbing left-sided chest shooting discomfort which usually last seconds.  He notes this after activity.  He continued to work at [employer].  He denied any exertional chest tightness.  His blood pressure has been somewhat elevated with typical systolics in the 140 range. He was on diltiazem 240 mg daily for hypertension, rosuvastatin 40 mg for hyperlipidemia, pantoprazole 40 mg for Protonix and methimazole 5 mg for hyperthyroidism.    Since I saw him, he underwent laboratory November 2021.  LDL cholesterol was 56 on rosuvastatin 40 mg.  Creatinine was 1.02.  Over the past several months, he has continued to remain stable.  He continues to be on diltiazem 240 mg in addition to his losartan 25 mg daily.  He denies any significant chest pain.  He denies palpitations.  He presents for  reevaluation.  Past Medical History:  Diagnosis Date  . Gallstones   . Gastric polyps   . GERD (gastroesophageal reflux disease)   . HLD (hyperlipidemia)   . Hypertension   . Hyperthyroidism   . 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 His AHI was 0.4; however, his RDI was 8.2. He had an increased arousals. There was no evidence for nocturnal myoclonus.  . S/P cardiac cath 01/29/14    but with area of kinking obstructing the LCX to 50%. other wise patent coronary arteries.  . Sinus bradycardia 2015   Event monitor showed no critical bradycardia, tolerating low-dose beta blocker  . Spinal stenosis     Past Surgical History:  Procedure Laterality Date  . CHOLECYSTECTOMY    . LEFT HEART CATHETERIZATION WITH CORONARY ANGIOGRAM N/A 01/29/2014  Procedure: LEFT HEART CATHETERIZATION WITH CORONARY ANGIOGRAM;  Surgeon: Lyn Records III, MD; patent coronary arteries  but with area of kinking obstructing the LCX to 50%. EF nl      . NECK SURGERY Left    titanium plate with screws    Allergies  Allergen Reactions  . Atorvastatin Other (See Comments) and Nausea And Vomiting    States he had to go seek medical care secondary to degree of Abd discomfort    . Enalapril Cough  . Lyrica [Pregabalin] Palpitations  . Neurontin [Gabapentin] Palpitations  . Prednisone Nausea Only    Current Outpatient Medications  Medication Sig Dispense Refill  . aspirin 81 MG EC tablet Take by mouth.    . diltiazem (CARDIZEM CD) 240 MG 24 hr capsule Take 1 capsule (240 mg total) by mouth daily. 90 capsule 1  . ferrous sulfate 325 (65 FE) MG tablet Take by mouth.    . loratadine (CLARITIN) 10 MG tablet Take by mouth.    . methimazole (TAPAZOLE) 5 MG tablet Take by mouth.    Marland Kitchen oxymetazoline (AFRIN NASAL SPRAY) 0.05 % nasal spray Afrin (oxymetazoline) 0.05 % nasal spray  2 sprays in left  nostril today, then as needed 2x a day if nose bleed.    . rosuvastatin (CRESTOR) 40 MG tablet Take by mouth.    . losartan (COZAAR) 50 MG tablet Take 1 tablet (50 mg total) by mouth daily. 90 tablet 3   No current facility-administered medications for this visit.    Social History   Socioeconomic History  . Marital status: Married    Spouse name: Not on file  . Number of children: 2  . Years of education: Not on file  . Highest education level: Not on file  Occupational History  . Occupation: retired Emergency planning/management officer  . Occupation: [employer]  Tobacco Use  . Smoking status: Never Smoker  . Smokeless tobacco: Never Used  Substance and Sexual Activity  . Alcohol use: No  . Drug use: No  . Sexual activity: Yes  Other Topics Concern  . Not on file  Social History Narrative   He is married he has 3 sons 2 daughters   He works as a Nurse, adult for [employer], he is a retired Emergency planning/management officer   No alcohol tobacco or drug use   Social Determinants of Community education officer: Not on Ship broker Insecurity: Not on file  Transportation Needs: Not on file  Physical Activity: Not on file  Stress: Not on file  Social Connections: Not on file  Intimate Partner Violence: Not on file   Social history is notable that he is married, has 5 children.  He is retired Emergency planning/management officer in [place], [place] for 28 years.  He now lives in [place].  There is no tobacco use.  Family History  Problem Relation Age of Onset  . Heart Problems Sister   . Heart failure Father   . Hypertension Father   . Pancreatic cancer Father   . Lung cancer Sister   . Diabetes Mother   . Stroke Brother    Family history is also notable that his mother is still living at age 55.  Father died at age 33 and had undergone CABG surgery at 67.  He has 5 siblings and several sisters who have had SVT.  One sister was recently diagnosed with adrenal gland issues.  ROS General:  Negative; No fevers, chills, or night  sweats HEENT: Negative; No changes in vision or hearing, sinus congestion, difficulty swallowing Pulmonary: Negative; No cough, wheezing, shortness of breath, hemoptysis Cardiovascular: See HPI:  GI: Positive for GERD GU: Negative; No dysuria, hematuria, or difficulty voiding Musculoskeletal: Negative; no myalgias, joint pain, or weakness Hematologic: Negative; no easy bruising, bleeding Endocrine: Positive for hyperthyroidism on Tapazole Neuro: Negative; no changes in balance, headaches Skin: Negative; No rashes or skin lesions Psychiatric: Negative; No behavioral problems, depression Sleep: Negative; No snoring,  daytime sleepiness, hypersomnolence, bruxism, restless legs, hypnogognic hallucinations. Other comprehensive 14 point system review is negative   Physical Exam BP (!) 156/84   Pulse (!) 58   Ht 6\' 1"  (1.854 m)   Wt 214 lb 12.8 oz (97.4 kg)   SpO2 98%   BMI 28.34 kg/m    Repeat blood pressure by me was 158/82  Wt Readings from Last 3 Encounters:  05/21/20 214 lb 12.8 oz (97.4 kg)  01/27/20 205 lb 12.8 oz (93.4 kg)  05/04/18 202 lb 6 oz (91.8 kg)   General: Alert, oriented, no distress.  Skin: normal turgor, no rashes, warm and dry HEENT: Normocephalic, atraumatic. Pupils equal round and reactive to light; sclera anicteric; extraocular muscles intact;  Nose without nasal septal hypertrophy Mouth/Parynx benign; Mallinpatti scale 3 Neck: No JVD, no carotid bruits; normal carotid upstroke Lungs: clear to ausculatation and percussion; no wheezing or rales Chest wall: without tenderness to palpitation Heart: PMI not displaced, RRR, s1 s2 normal, 1/6 systolic murmur, no diastolic murmur, no rubs, gallops, thrills, or heaves Abdomen: soft, nontender; no hepatosplenomehaly, BS+; abdominal aorta nontender and not dilated by palpation. Back: no CVA tenderness Pulses 2+ Musculoskeletal: full range of motion, normal strength, no joint  deformities Extremities: no clubbing cyanosis or edema, Homan's sign negative  Neurologic: grossly nonfocal; Cranial nerves grossly wnl Psychologic: Normal mood and affect   ECG (independently read by me): Sinus bradycardia 58 bpm, first-degree AV block with a PR interval of 214 ms.  Nonspecific T changes.  No ectopy.  January 27, 2020 ECG (independently read by me): Sinu bradycardia at 56; 1st degree block; PR 218 msec, no ectopy  July 22, 2016 ECG (independently read by me): Normal sinus rhythm at 63 bpm.  Borderline first-degree AV block with a PR interval of 204 ms.  No ST segment changes  March 2018 ECG (independently read by me): Sinus bradycardia 53 bpm.  Normal intervals.  No ST segment changes.  January 2017 ECG (independently read by me): Sinus bradycardia 55 bpm.  Borderline first-degree AV block with a PR interval at 204 ms.  No ST-T changes  July 2016 ECG (independently read by me):  Normal sinus rhythm at 63 bpm.  Borderline first-degree AV block with a PR interval at 206 ms.  ECG (independently read by me): Normal sinus rhythm at 68 bpm.  Nonspecific ST changes in lead 3.  QTc interval 414 ms.  LABS:  BMP Latest Ref Rng & Units 01/29/2020 06/15/2016 09/16/2015  Glucose 65 - 99 mg/dL 84 89 161(W)  BUN 8 - 27 mg/dL 12 10 12   Creatinine 0.76 - 1.27 mg/dL 9.60 4.54(U) 9.81  BUN/Creat Ratio 10 - 24 12 - -  Sodium 134 - 144 mmol/L 143 141 135  Potassium 3.5 - 5.2 mmol/L 4.1 4.0 4.1  Chloride 96 - 106 mmol/L 104 105 99(L)  CO2 20 - 29 mmol/L 29 28 28   Calcium 8.6 - 10.2 mg/dL 9.8 9.0 9.5     Hepatic Function Latest Ref Rng &  Units 01/29/2020 06/15/2016 09/16/2015  Total Protein 6.0 - 8.5 g/dL 7.3 6.7 7.0  Albumin 3.8 - 4.8 g/dL 4.5 4.0 6.5(H)  AST 0 - 40 IU/L 28 17 42(H)  ALT 0 - 44 IU/L 25 14 230(H)  Alk Phosphatase 44 - 121 IU/L 113 71 116  Total Bilirubin 0.0 - 1.2 mg/dL 0.4 0.7 0.8  Bilirubin, Direct 0.0 - 0.3 mg/dL - - -    CBC Latest Ref Rng & Units 01/29/2020  06/15/2016 09/15/2015  WBC 3.4 - 10.8 x10E3/uL 5.6 6.2 12.0(H)  Hemoglobin 13.0 - 17.7 g/dL 84.6 96.2 95.2  Hematocrit 37.5 - 51.0 % 42.6 42.0 42.4  Platelets 150 - 450 x10E3/uL 214 265 223   Lab Results  Component Value Date   MCV 85 01/29/2020   MCV 88.2 06/15/2016   MCV 87.8 09/15/2015    BNP No results found for: BNP  ProBNP    Component Value Date/Time   PROBNP <30.0 04/16/2007 0253     Lipid Panel     Component Value Date/Time   CHOL 115 01/29/2020 0940   TRIG 61 01/29/2020 0940   HDL 46 01/29/2020 0940   CHOLHDL 2.5 01/29/2020 0940   CHOLHDL 5.3 (H) 06/15/2016 1031   VLDL 27 06/15/2016 1031   LDLCALC 56 01/29/2020 0940     RADIOLOGY: No results found.  IMPRESSION:  1. Coronary artery disease involving native coronary artery of native heart without angina pectoris   2. Essential hypertension   3. Hyperlipidemia LDL goal <70   4. Atypical chest pain     ASSESSMENT AND PLAN: [name] is a 76 -year-old retired Emergency planning/management officer who has a long-standing history of hypertension, as well as a strong family history for CAD but this occurred at an older age with his father.  In November 2015 he was awakened with substernal chest tightness associated with diaphoresis and shortness of breath.  Cardiac catheterization revealed 30-50% areas of apparent kinking in the circumflex and RCA.   He had been on amlodipine as well as beta blocker therapy since his catheterization.  Amlodipine was discontinued by his primary physician due to concerns of burning and tingling in his chest. I'm not certain that this was related to amlodipine.  His erectile dysfunction  improved with changing from metoprolol to Bystolic.  He developed significant cervical spine disease and underwent surgery at [facility] in June 2017 involving C3-C7 and had significant elevation of liver function studies.  He was told that this may be due to fatty liver.  LFTs normalized.  He had undergone a normal exercise  treadmill test in November 2019 which was low risk and did not reveal any ischemic changes.  He has had continued episodes of somewhat atypical chest pain which is not felt to be ischemic mediated.  He had recently been evaluated in [facility] and an echo Doppler study showed normal systolic function.  There was no mention of diastolic function.  He had normal valvular architecture.  When I last saw him in November 2021, losartan 25 mg was added to his medical regimen for increased blood pressure.  Blood pressure today continues to be elevated.  On further titrating losartan to 50 mg daily with target less than 130/80.  I again discussed optimal blood pressure less than 120/80 and for every 20/10 increase there is a potential doubling of cardiovascular risk.  He continues to be on rosuvastatin and LDL cholesterol was excellent at 56.  Renal function is stable.  He is no  longer having his previous atypical chest discomfort.  Will monitor his blood pressure at home.  I will see him in 6 months for reevaluation.   Jason Bihari, MD, [facility]  05/23/2020 12:28 PM

## 2020-05-21 NOTE — Patient Instructions (Addendum)
Medication Instructions:  INCREASE LOSARTAN TO 50mg  DAILY-  A NEW PRESCRIPTION FOR LOSARTAN 50mg  TABLETS HAS BEEN SENT IN TO YOUR PHARMACY- PLEASE TAKE ONE TABLET DAILY  *If you need a refill on your cardiac medications before your next appointment, please call your pharmacy*  Follow-Up: At [facility], you and your health needs are our priority.  As part of our continuing mission to provide you with exceptional heart care, we have created designated Provider Care Teams.  These Care Teams include your primary Cardiologist (physician) and Advanced Practice Providers (APPs -  Physician Assistants and Nurse Practitioners) who all work together to provide you with the care you need, when you need it.   Your next appointment:   6 month(s)  The format for your next appointment:   In Person  Provider:   , MD

## 2020-05-23 ENCOUNTER — Encounter: Payer: Self-pay | Admitting: Cardiovascular Disease

## 2020-05-28 ENCOUNTER — Ambulatory Visit
Admission: RE | Admit: 2020-05-28 | Discharge: 2020-05-28 | Disposition: A | Discharge status: Home / Self Care | Payer: BLUE CROSS/BLUE SHIELD | Source: Ambulatory Visit | Attending: Physician Assistant | Admitting: Physician Assistant

## 2020-05-28 DIAGNOSIS — M5412 Radiculopathy, cervical region: Secondary | ICD-10-CM

## 2020-05-28 DIAGNOSIS — Z9889 Other specified postprocedural states: Secondary | ICD-10-CM

## 2020-05-28 DIAGNOSIS — M4802 Spinal stenosis, cervical region: Secondary | ICD-10-CM | POA: Diagnosis not present

## 2020-05-28 DIAGNOSIS — M542 Cervicalgia: Secondary | ICD-10-CM

## 2020-07-01 DIAGNOSIS — M542 Cervicalgia: Secondary | ICD-10-CM | POA: Diagnosis not present

## 2020-07-01 DIAGNOSIS — M4312 Spondylolisthesis, cervical region: Secondary | ICD-10-CM | POA: Diagnosis not present

## 2020-07-01 DIAGNOSIS — Z981 Arthrodesis status: Secondary | ICD-10-CM | POA: Diagnosis not present

## 2020-07-07 ENCOUNTER — Other Ambulatory Visit: Payer: Self-pay | Admitting: Nurse Practitioner

## 2020-07-07 DIAGNOSIS — Z981 Arthrodesis status: Secondary | ICD-10-CM

## 2020-07-07 DIAGNOSIS — Z630 Problems in relationship with spouse or partner: Secondary | ICD-10-CM | POA: Diagnosis not present

## 2020-07-21 ENCOUNTER — Ambulatory Visit
Admission: RE | Admit: 2020-07-21 | Discharge: 2020-07-21 | Disposition: A | Discharge status: Home / Self Care | Payer: BLUE CROSS/BLUE SHIELD | Source: Ambulatory Visit | Attending: Nurse Practitioner | Admitting: Nurse Practitioner

## 2020-07-21 DIAGNOSIS — M542 Cervicalgia: Secondary | ICD-10-CM | POA: Diagnosis not present

## 2020-07-21 DIAGNOSIS — S199XXA Unspecified injury of neck, initial encounter: Secondary | ICD-10-CM | POA: Diagnosis not present

## 2020-07-21 DIAGNOSIS — Z981 Arthrodesis status: Secondary | ICD-10-CM

## 2020-07-29 DIAGNOSIS — Z630 Problems in relationship with spouse or partner: Secondary | ICD-10-CM | POA: Diagnosis not present

## 2020-10-09 DIAGNOSIS — R12 Heartburn: Secondary | ICD-10-CM | POA: Diagnosis not present

## 2020-10-09 DIAGNOSIS — Z0001 Encounter for general adult medical examination with abnormal findings: Secondary | ICD-10-CM | POA: Diagnosis not present

## 2020-10-09 DIAGNOSIS — Z13220 Encounter for screening for lipoid disorders: Secondary | ICD-10-CM | POA: Diagnosis not present

## 2020-10-09 DIAGNOSIS — R0789 Other chest pain: Secondary | ICD-10-CM | POA: Diagnosis not present

## 2020-10-09 DIAGNOSIS — M542 Cervicalgia: Secondary | ICD-10-CM | POA: Diagnosis not present

## 2020-10-09 DIAGNOSIS — Z801 Family history of malignant neoplasm of trachea, bronchus and lung: Secondary | ICD-10-CM | POA: Diagnosis not present

## 2020-10-09 DIAGNOSIS — Z79899 Other long term (current) drug therapy: Secondary | ICD-10-CM | POA: Diagnosis not present

## 2020-10-09 DIAGNOSIS — Z1321 Encounter for screening for nutritional disorder: Secondary | ICD-10-CM | POA: Diagnosis not present

## 2020-10-09 DIAGNOSIS — Z0000 Encounter for general adult medical examination without abnormal findings: Secondary | ICD-10-CM | POA: Diagnosis not present

## 2020-10-09 DIAGNOSIS — Z8249 Family history of ischemic heart disease and other diseases of the circulatory system: Secondary | ICD-10-CM | POA: Diagnosis not present

## 2020-10-09 DIAGNOSIS — Z125 Encounter for screening for malignant neoplasm of prostate: Secondary | ICD-10-CM | POA: Diagnosis not present

## 2020-10-09 DIAGNOSIS — R251 Tremor, unspecified: Secondary | ICD-10-CM | POA: Diagnosis not present

## 2020-10-09 DIAGNOSIS — D509 Iron deficiency anemia, unspecified: Secondary | ICD-10-CM | POA: Diagnosis not present

## 2020-10-09 DIAGNOSIS — Z800 Family history of malignant neoplasm of digestive organs: Secondary | ICD-10-CM | POA: Diagnosis not present

## 2020-11-26 DIAGNOSIS — F330 Major depressive disorder, recurrent, mild: Secondary | ICD-10-CM | POA: Diagnosis not present

## 2020-12-08 DIAGNOSIS — F330 Major depressive disorder, recurrent, mild: Secondary | ICD-10-CM | POA: Diagnosis not present

## 2020-12-29 ENCOUNTER — Encounter: Payer: Self-pay | Admitting: *Deleted

## 2020-12-30 ENCOUNTER — Ambulatory Visit: Payer: BLUE CROSS/BLUE SHIELD | Admitting: Neurology

## 2020-12-30 ENCOUNTER — Encounter: Payer: Self-pay | Admitting: Neurology

## 2020-12-30 VITALS — BP 151/89 | HR 68 | Ht 73.0 in | Wt 223.2 lb

## 2020-12-30 DIAGNOSIS — R29818 Other symptoms and signs involving the nervous system: Secondary | ICD-10-CM | POA: Diagnosis not present

## 2020-12-30 DIAGNOSIS — R29898 Other symptoms and signs involving the musculoskeletal system: Secondary | ICD-10-CM | POA: Diagnosis not present

## 2020-12-30 DIAGNOSIS — R251 Tremor, unspecified: Secondary | ICD-10-CM | POA: Diagnosis not present

## 2020-12-30 DIAGNOSIS — G20 Parkinson's disease: Secondary | ICD-10-CM

## 2020-12-30 NOTE — Patient Instructions (Signed)
It was very nice meeting you today.  I think you have signs and symptoms of mild parkinsonism, possibly left-sided predominant Parkinson's disease.   As discussed, I would like to proceed with a so-called DaT scan: This is a specialized brain scan designed to help with diagnosis of tremor disorders. A radioactive marker gets injected and the uptake is measured in the brain and compared to normal controls and right side is compared to the left, a change in uptake can help with diagnosis of certain tremor disorders. A brain MRI on the other hand is a brain scan that helps look at the brain structure in more detail overall and look for age-related changes, blood vessel related changes and look for stroke and volume loss which we call atrophy.   Please continue with your healthy lifestyle, try to hydrate well with water, stay well rested, exercise on a regular basis, avoid heavy lifting and to not over strain your muscles but resistance training would be good.  I do want to suggest a few additional things today:  Please be really proactive with your constipation medication regimen, titrating as needed to where you have a formed stool at least every other day.   Remember to drink plenty of fluid at least 6 glasses (8 oz each), eat healthy meals and do not skip any meals. Try to eat protein with a every meal and eat a healthy snack such as fruit or nuts in between meals. Try to keep a regular sleep-wake schedule and try to exercise daily, particularly in the form of walking, 20-30 minutes a day, if you can.   Try to stay active physically and mentally. Engage in social activities in your community and with your family and try to keep up with current events by reading the newspaper or watching the news. Try to do word puzzles and you may like to do puzzles and brain games on the computer such as on [email or web address]   As far as diagnostic testing, I will order the DaTscan, we will consider doing a brain MRI down  the road.    I would like to see you back after the scan, we will also keep you posted by phone call as to its results.    Our phone number is [phone]. We also have an after hours call service for urgent matters and there is a physician on-call for urgent questions, that cannot wait till the next work day. For any emergencies you know to call 911 or go to the nearest emergency room.

## 2020-12-30 NOTE — Progress Notes (Signed)
Subjective:    Patient ID: [name] is a 65 y.o. male.  HPI    Jason Foley, MD, PhD [facility] [address], [address] [address] [place], [place] [address]  Dear Tish Men,   I saw your patient, [name], upon your kind request in the neurologic clinic today for initial consultation of his hand tremor.  The patient is unaccompanied today.  As you know, [name] is a 65 year old right-handed gentleman with an underlying medical history of coronary artery disease, hypertension, hyperlipidemia, bradycardia, cervical spinal stenosis, reflux disease, history of presyncope, and overweight state, who reports an approximately 1 year history of bilateral hand tremors, tremors are intermittent and primarily left-sided, primarily when he holds something that has some heft to it.  He has noticed some discomfort in his left knee area, almost like he strained it while weightlifting.  He feels stiff in the left hand, sometimes even feels like he has to pry the fingers open.  He has not fallen but has noticed some changes in his balance especially on uneven ground.  He works at a Programme researcher, broadcasting/film/video.  He is married and lives with his wife, he has 2 grown children, daughter lives in [place], his younger son is nearly 76 years old and lives with, he works as an Interior and spatial designer.  Patient is a non-smoker and does not utilize alcohol, he drinks caffeine occasionally in the form of coffee, not daily.  He has no family history of Parkinson's disease.  He had a total of 1 brother and 4 sisters, he has 3 sisters alive.  He had neck surgery in June 2017 at [facility].  I reviewed your office note from 10/09/2020.  He had blood work at the time including TSH and vitamin B12, CBC, CMP, iron studies, lipid profile, PSA.  I was able to review his blood test results in care everywhere.  Vitamin B12 was on the lower end of the spectrum at 331, chemistry panel showed benign results, creatinine  1.11, BUN 12, TSH was 2.08.  Lipid panel showed total cholesterol of 121, LDL 59, triglycerides 69, HDL 47.   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  His AHI was 0.4; however, his RDI was 8.2.  He had an increased arousals.  There was no evidence for nocturnal myoclonus.   S/P cardiac cath 01/29/2014    but with area of kinking obstructing the LCX to 50%. other wise patent coronary arteries.   Sinus bradycardia 2015   Event monitor showed no critical bradycardia, tolerating low-dose beta blocker   Spinal stenosis     His Past Surgical History Is Significant For: Past Surgical History:  Procedure Laterality Date   CHOLECYSTECTOMY     LEFT HEART CATHETERIZATION WITH CORONARY ANGIOGRAM N/A 01/29/2014   Procedure: LEFT HEART CATHETERIZATION WITH CORONARY ANGIOGRAM;  Surgeon: Lyn Records III, MD; patent coronary arteries  but with area of kinking obstructing the LCX to 50%. EF nl       NECK SURGERY Left    titanium plate with screws    His Family History Is Significant For: Family  History  Problem Relation Age of Onset   Diabetes Mother    Heart failure Father    Hypertension Father    Pancreatic cancer Father    Heart Problems Sister    Lung cancer Sister    Stroke Brother    Tremor Neg Hx     His Social History Is Significant For: Social History   Socioeconomic History   Marital status: Married    Spouse name: Not on file   Number of children: 2   Years of education: Not on file   Highest education level: Not on file  Occupational History   Occupation: retired Emergency planning/management officer   Occupation: [employer]  Tobacco Use   Smoking status: Never   Smokeless tobacco: Never  Building services engineer Use: Never used  Substance and Sexual Activity   Alcohol use: No   Drug use: No   Sexual activity: Yes  Other Topics Concern   Not on file  Social History Narrative   He is married he has 3 sons 2 daughters   He works as a Nurse, adult for [employer], he is a retired Emergency planning/management officer   No alcohol tobacco or drug use   Social Determinants of Community education officer: Not on Ship broker Insecurity: Not on file  Transportation Needs: Not on file  Physical Activity: Not on file  Stress: Not on file  Social Connections: Not on file    His Allergies Are:  Allergies  Allergen Reactions   Atorvastatin Other (See Comments) and Nausea And Vomiting    States he had to go seek medical care secondary to degree of Abd discomfort     Enalapril Cough   Lyrica [Pregabalin] Palpitations   Neurontin [Gabapentin] Palpitations   Prednisone Nausea Only  :   His Current Medications Are:  Outpatient Encounter Medications as of 12/30/2020  Medication Sig   cyclobenzaprine (FLEXERIL) 10 MG tablet Take by mouth.   diltiazem (CARDIZEM CD) 240 MG 24 hr capsule Take 1 capsule (240 mg total) by mouth daily.   diltiazem (TIAZAC) 240 MG 24 hr capsule Take 1 capsule by mouth daily.   loratadine (CLARITIN) 10 MG tablet Take by mouth.   losartan (COZAAR) 50 MG tablet Take 1 tablet (50 mg total) by mouth daily.   methimazole (TAPAZOLE) 5 MG tablet Take by mouth.   rosuvastatin (CRESTOR) 40 MG tablet Take by mouth.   aspirin 81 MG EC tablet Take by mouth.   ferrous sulfate 325 (65 FE) MG tablet Take by mouth.   oxymetazoline (AFRIN NASAL SPRAY) 0.05 % nasal spray Afrin (oxymetazoline) 0.05 % nasal spray  2 sprays in left nostril today, then as needed 2x a day if nose bleed.   pantoprazole (PROTONIX) 40 MG tablet Take 1 tablet by mouth daily.   No facility-administered encounter  medications on file as of 12/30/2020.  :   Review of Systems:  Out of a complete 14 point review of systems, all are reviewed and negative with the exception of these symptoms as listed below:  Review of Systems  Neurological:        Pt is here for tremors. Pt states tremors are in both hands. Pt states tremors are really noticeable when trying to hold something in his hands .     Objective:  Neurological Exam  Physical Exam Physical Examination:   Vitals:   12/30/20 0801  BP: (!) 151/89  Pulse: 68  General Examination: The patient is a very pleasant 65 y.o. male in no acute distress. He appears well-developed and well-nourished and well groomed.   HEENT: Normocephalic, atraumatic, pupils are equal, round and reactive to light, extraocular tracking is well-preserved, perhaps slight hesitation with up-and-down gaze, no nystagmus.  Hearing is grossly intact.  Face is symmetric with mild facial masking, no lip, neck or jaw tremor, he has a mildly decreased eye blink rate.  Speech is clear without dysarthria but a slight degree of hypophonia.  He has no voice tremor.  Neck is with increase in tone, active mobility is fairly good, passive mobility is decreased.  Airway examination reveals mild mouth dryness.  Tongue protrudes centrally and palate elevates symmetrically.  No carotid bruits.  Chest: Clear to auscultation without wheezing, rhonchi or crackles noted.  Heart: S1+S2+0, regular and normal without murmurs, rubs or gallops noted.   Abdomen: Soft, non-tender and non-distended.  Extremities: There is no pitting edema in the distal lower extremities bilaterally.   Skin: Warm and dry without trophic changes noted.   Musculoskeletal: exam reveals no obvious joint deformities, tenderness or joint swelling or erythema.   Neurologically:  Mental status: The patient is awake, alert and oriented in all 4 spheres. His immediate and remote memory, attention, language skills and fund  of knowledge are appropriate. There is no evidence of aphasia, agnosia, apraxia or anomia. Speech as above.  Thought process is linear, mood is congruent, affect is normal to slightly blunted.   Cranial nerves II - XII are as described above under HEENT exam.   Motor exam: Normal bulk, and strength.  He has a mildly increased tone in both upper extremities with contralateral activation.  He has a very slight and intermittent resting tremor in the left thumb area.  He has no other resting tremor.  He has no significant postural or action tremor or intention tremor, with the exception of a slight postural tremor with holding something with the left hand.  On 12/30/2020: On Archimedes spiral drawing he has no significant trembling with either hand, handwriting with the right hand is not tremulous, neat and legible, not particularly micrographic.  Reflexes are 2+ throughout, toes are downgoing bilaterally.  On fine motor testing, he has normal finger taps and hand movements as well as rapid alternating patting with the right hand, with the left hand he has mild impairment with the finger taps, hand movements a slightly slower.  Overall he has very mild bradykinesia.  He has no freezing episodes.  He has very slight decrease in foot taps on the left side, normal on the right.  Cerebellar testing: No dysmetria or intention tremor.  Finger-to-nose unremarkable, slight difficulty with heel-to-shin due to stiffness reported but no dysmetria.  There is no truncal or gait ataxia.  Sensory exam: intact to light touch in the upper and lower extremities.  Gait, station and balance: He stands without difficulty, posture is slightly stooped for age.  He walks with mild decrease in arm swing on the left.  No shuffling noted.  He turns slightly slowly but without difficulty otherwise.  Balance is well-preserved.    Assessment and Plan:   In summary, [name] is a very pleasant 65 y.o.-year old male with an underlying  medical history of coronary artery disease, hypertension, hyperlipidemia, bradycardia, cervical spinal stenosis, reflux disease, history of presyncope, and overweight state, who presents for evaluation of his tremor disorder of approximately 1 years duration.  History and examination are in keeping with  parkinsonism with left-sided predominance and left-sided lateralization on examination noted.  I talked to the patient at length about this diagnosis, and the possibility of symptomatic treatment options, healthy lifestyle, and monitoring.  He is advised that there are good symptomatic medication options available.  We talked briefly about utilizing a dopamine agonist versus levodopa versus an enzyme inhibitors such as Azilect.  He may be a good candidate for monotherapy with Azilect.  We will pick up our discussion in the next visit.  He is for now advised to proceed with a scan called a DaTscan.  I explained the scan to him and the expectation from the scan.  This is not a definitive test for Parkinson's disease but a good supportive tool.  We will obtain informed consent today and start with the authorization through his insurance.  We will call him soon to schedule this test and plan a follow-up afterwards.  He is welcome to bring his wife at the next visit.  He is advised to stay well-hydrated and well rested and pursue regular exercise and continue with his healthy lifestyle.  I answered all his questions today and he was in agreement with the plan.  This was an extended visit including precharting and chart review.  Thank you very much for allowing me to participate in the care of this nice patient. If I can be of any further assistance to you please do not hesitate to call me at [phone].  Sincerely,   Jason Foley, MD, PhD

## 2020-12-30 NOTE — Progress Notes (Signed)
DATSCAN consent signed and given to TF in referrals.

## 2021-01-04 ENCOUNTER — Ambulatory Visit (HOSPITAL_BASED_OUTPATIENT_CLINIC_OR_DEPARTMENT_OTHER): Payer: BLUE CROSS/BLUE SHIELD | Admitting: Family

## 2021-01-04 ENCOUNTER — Encounter (HOSPITAL_BASED_OUTPATIENT_CLINIC_OR_DEPARTMENT_OTHER): Payer: Self-pay | Admitting: Family

## 2021-01-04 ENCOUNTER — Other Ambulatory Visit: Payer: Self-pay

## 2021-01-04 VITALS — BP 130/70 | HR 62 | Ht 73.0 in | Wt 221.0 lb

## 2021-01-04 DIAGNOSIS — E782 Mixed hyperlipidemia: Secondary | ICD-10-CM | POA: Diagnosis not present

## 2021-01-04 DIAGNOSIS — I25118 Atherosclerotic heart disease of native coronary artery with other forms of angina pectoris: Secondary | ICD-10-CM | POA: Diagnosis not present

## 2021-01-04 DIAGNOSIS — I10 Essential (primary) hypertension: Secondary | ICD-10-CM

## 2021-01-04 MED ORDER — LOSARTAN POTASSIUM 50 MG PO TABS: 75.0000 mg | ORAL_TABLET | Freq: Every day | ORAL | 3 refills | Status: DC

## 2021-01-04 NOTE — Progress Notes (Signed)
Office Visit    Patient Name: [name] Date of Encounter: 01/04/2021  PCP:  Barbie Banner, MD   [facility]  Cardiologist:  Nicki Guadalajara, MD  Advanced Practice Provider:  No care team member to display Electrophysiologist:  None      Chief Complaint    [name] is a 65 y.o. 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  His AHI was 0.4; however, his RDI was 8.2.  He had an increased arousals.  There was no evidence for nocturnal myoclonus.   S/P cardiac cath 01/29/2014    but with area of kinking obstructing the LCX to 50%. other wise patent coronary arteries.   Sinus bradycardia 2015   Event monitor showed no critical bradycardia, tolerating low-dose beta blocker   Spinal stenosis    Past Surgical History:  Procedure Laterality Date   CHOLECYSTECTOMY     LEFT HEART CATHETERIZATION WITH CORONARY ANGIOGRAM N/A 01/29/2014   Procedure: LEFT HEART CATHETERIZATION WITH CORONARY ANGIOGRAM;  Surgeon: Lyn Records III, MD; patent coronary arteries  but with area of kinking obstructing the LCX to 50%. EF nl       NECK SURGERY Left    titanium plate with screws    Allergies  Allergies  Allergen Reactions   Atorvastatin Other (See Comments) and Nausea And Vomiting    States he had to go seek medical care secondary to degree of Abd discomfort     Enalapril Cough   Lyrica [Pregabalin] Palpitations   Neurontin [Gabapentin] Palpitations   Prednisone Nausea Only    History of Present Illness    [name] is a  65 y.o. male with a hx of CAD, HTN, HLD, hyperthyroidism  last seen 05/21/20 by Dr. Tresa Endo.  He has followed routinely with Dr. Tresa Endo.  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  For the last few years he has been working for [employer].  Cardiac history dates back to 2015 when he presented with chest pain and underwent cardiac catheterization showing both RCA and circumflex coronary arteries contained region of kinking that appeared obstructive up to 50% with normal LVEF.  Sleep study November 2015 suggesting increased upper airway resistance syndrome but no definitive sleep apnea.  Metoprolol has previously been transition to Bystolic due to erectile dysfunction but eventually discontinued due to orthostatic symptoms.  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  He underwent cervical C3-7 spine surgery at [facility] in June 2017.  Her cardiogram May 2021 ordered due to murmur with LVEF 60 to 65%, no wall motion abnormalities, normal atria size bilaterally, normal heart valves.  He was last seen in February 2022 doing well from a cardiac perspective.  His losartan was increased to 50 mg daily due to hypertension.  Presents today for follow-up.Started having headache Friday and checked blood pressure which was high. BP 165/90 at home with  arm cuff. He took a couple ibuprofen for headache which eventually helped. He takes his Losartan and Diltiazem in the morning around 6:45am. Reports no shortness of breath nor dyspnea on exertion. Reports no chest pain, pressure, or tightness. No edema, orthopnea, PND. Reports no palpitations.    10/09/20 TSH 2.08, ALT 22, AST 21, creatinine 1.11, GFR 75   EKGs/Labs/Other Studies Reviewed:   The following studies were reviewed today:   EKG:  no EKG today  Recent Labs: 01/29/2020: ALT 25; BUN 12; Creatinine, Ser 1.02; Hemoglobin  14.0; Platelets 214; Potassium 4.1; Sodium 143  Recent Lipid Panel    Component Value Date/Time   CHOL 115 01/29/2020 0940   TRIG 61 01/29/2020 0940   HDL 46 01/29/2020 0940   CHOLHDL 2.5 01/29/2020 0940   CHOLHDL 5.3 (H) 06/15/2016 1031   VLDL 27 06/15/2016 1031   LDLCALC 56 01/29/2020 0940   Home Medications   Current Meds  Medication Sig   cyclobenzaprine (FLEXERIL) 10 MG tablet Take 10 mg by mouth as needed for muscle spasms.   diltiazem (CARDIZEM CD) 240 MG 24 hr capsule Take 1 capsule (240 mg total) by mouth daily.   methimazole (TAPAZOLE) 5 MG tablet Take 5 mg by mouth daily.   oxymetazoline (AFRIN NASAL SPRAY) 0.05 % nasal spray Afrin (oxymetazoline) 0.05 % nasal spray  2 sprays in left nostril today, then as needed 2x a day if nose bleed.   rosuvastatin (CRESTOR) 40 MG tablet Take 40 mg by mouth daily.   [DISCONTINUED] losartan (COZAAR) 50 MG tablet Take 1 tablet (50 mg total) by mouth daily.     Review of Systems      All other systems reviewed and are otherwise negative except as noted above.  Physical Exam    VS:  BP 130/70   Pulse 62   Ht 6\' 1"  (1.854 m)   Wt 221 lb (100.2 kg)   SpO2 98%   BMI 29.16 kg/m  , BMI Body mass index is 29.16 kg/m.  Wt Readings from Last 3 Encounters:  01/04/21 221 lb (100.2 kg)  12/30/20 223 lb 3.2 oz (101.2 kg)  05/21/20 214 lb 12.8 oz (97.4 kg)     GEN: Well nourished, well developed, in no acute distress. HEENT: normal. Neck: Supple, no JVD, carotid bruits, or masses. Cardiac: RRR, no murmurs, rubs, or gallops. No clubbing, cyanosis, edema.  Radials/PT 2+ and equal bilaterally.  Respiratory:  Respirations regular and unlabored, clear to auscultation bilaterally. GI: Soft, nontender, nondistended. MS: No deformity or atrophy. Skin: Warm and dry, no rash. Neuro:  Strength and sensation are intact. Psych: Normal affect.  Assessment & Plan    CAD - Stable with no anginal symptoms. No indication for ischemic evaluation.   GDMT includes aspirin, rosuvastatin. No BB due to previous orthostasis. Heart healthy diet and regular cardiovascular exercise encouraged.    HTN - BP above goal of 130/80.  Continue diltiazem 240 mg daily.  Increase losartan from 50 mg to 75 mg daily.  Could consider future up titration versus combination with hydrochlorothiazide if remains uncontrolled.  He will have repeat BMP in 1 week for monitoring.  Home monitoring encouraged.  HLD, LDL goal <70 -continue rosuvastatin 40 mg daily.  Hyperthyroidism - Normal TSH 09/2020. Continue current dose Methimazole.  Disposition: Follow up in 3 month(s) with Dr. 10/2020 or APP.  Signed, Tresa Endo, NP 01/04/2021, 4:43 PM [facility]

## 2021-01-04 NOTE — Patient Instructions (Addendum)
Medication Instructions:  Your physician has recommended you make the following change in your medication:  CHANGE Losartan to 1.5 tablets (75mg ) once per day   *If you need a refill on your cardiac medications before your next appointment, please call your pharmacy*   Lab Work: Your physician recommends that you return for lab work in 1 week for BMP  If you have labs (blood work) drawn today and your tests are completely normal, you will receive your results only by: MyChart Message (if you have MyChart) OR A paper copy in the mail If you have any lab test that is abnormal or we need to change your treatment, we will call you to review the results.   Testing/Procedures: None ordered today.   Follow-Up: At [facility], you and your health needs are our priority.  As part of our continuing mission to provide you with exceptional heart care, we have created designated Provider Care Teams.  These Care Teams include your primary Cardiologist (physician) and Advanced Practice Providers (APPs -  Physician Assistants and Nurse Practitioners) who all work together to provide you with the care you need, when you need it.  We recommend signing up for the patient portal called "MyChart".  Sign up information is provided on this After Visit Summary.  MyChart is used to connect with patients for Virtual Visits (Telemedicine).  Patients are able to view lab/test results, encounter notes, upcoming appointments, etc.  Non-urgent messages can be sent to your provider as well.   To learn more about what you can do with MyChart, go to [facility].    Your next appointment:   2-3 month(s)  The format for your next appointment:   In Person  Provider:   You may see [email or web address], MD or one of the following Advanced Practice Providers on your designated Care Team:   Nicki Guadalajara, PA-C Azalee Course, PA-C or  Micah Flesher, Judy Pimple [place], NP    Other Instructions  Tips to Measure  your Blood Pressure Correctly  To determine whether you have hypertension, a medical professional will take a blood pressure reading. How you prepare for the test, the position of your arm, and other factors can change a blood pressure reading by 10% or more. That could be enough to hide high blood pressure, start you on a drug you don't really need, or lead your doctor to incorrectly adjust your medications.  National and international guidelines offer specific instructions for measuring blood pressure. If a doctor, nurse, or medical assistant isn't doing it right, don't hesitate to ask him or her to get with the guidelines.  Here's what you can do to ensure a correct reading:  Don't drink a caffeinated beverage or smoke during the 30 minutes before the test.  Sit quietly for five minutes before the test begins.  During the measurement, sit in a chair with your feet on the floor and your arm supported so your elbow is at about heart level.  The inflatable part of the cuff should completely cover at least 80% of your upper arm, and the cuff should be placed on bare skin, not over a shirt.  Don't talk during the measurement.  Have your blood pressure measured twice, with a brief break in between. If the readings are different by 5 points or more, have it done a third time.  In 2017, new guidelines from the American Heart Association, the 2018 of Cardiology, and nine other health organizations lowered the diagnosis of  high blood pressure to 130/80 mm Hg or higher for all adults. The guidelines also redefined the various blood pressure categories to now include normal, elevated, Stage 1 hypertension, Stage 2 hypertension, and hypertensive crisis (see "Blood pressure categories").  Blood pressure categories  Blood pressure category SYSTOLIC (upper number)  DIASTOLIC (lower number)  Normal Less than 120 mm Hg and Less than 80 mm Hg  Elevated 120-129 mm Hg and Less than 80 mm Hg  High  blood pressure: Stage 1 hypertension 130-139 mm Hg or 80-89 mm Hg  High blood pressure: Stage 2 hypertension 140 mm Hg or higher or 90 mm Hg or higher  Hypertensive crisis (consult your doctor immediately) Higher than 180 mm Hg and/or Higher than 120 mm Hg  Source: American Heart Association and American Stroke Association. For more on getting your blood pressure under control, buy Controlling Your Blood Pressure, a Special Health Report from [facility].

## 2021-01-05 DIAGNOSIS — F330 Major depressive disorder, recurrent, mild: Secondary | ICD-10-CM | POA: Diagnosis not present

## 2021-01-06 ENCOUNTER — Telehealth: Payer: Self-pay | Admitting: Neurology

## 2021-01-06 NOTE — Telephone Encounter (Signed)
Received DaTscan order & signed patient consent. Filled out DaTscan ppw for insurance authorization and gave to MD to sign. Signed ppw faxed to [employer]. They will determine if Berkley Harvey is needed & obtain. [employer] phone: [phone].

## 2021-01-07 ENCOUNTER — Telehealth (HOSPITAL_BASED_OUTPATIENT_CLINIC_OR_DEPARTMENT_OTHER): Payer: Self-pay | Admitting: Family

## 2021-01-07 NOTE — Telephone Encounter (Signed)
Agree with recommendations as provided. When he has his repeat lab work (ordered at recent clinic visit) we can reassess BP response and renal function and make further adjustments at that time if needed.   Alver Sorrow, NP

## 2021-01-07 NOTE — Telephone Encounter (Signed)
Returned call to patient who states he is concerned about elevated BP readings. On 10/11 he increased his losartan to 75 mg daily as advised by Hubbard Hartshorn, NP on 10/10. He continues to take cardizem 240 mg. He takes both of these medications in the morning and takes BP readings in the afternoon. I advised him that we would like to continue to monitor for at least 5 full days of therapy and asked him to send a MyChart message on Monday with his BP log. I reviewed the instructions given by Caitlyn on measuring BP appropriately and provided reassurance that with a few more days of data, Caitlyn will make further adjustments if needed. Patient verbalized understanding and agreement with plan and thanked me for the call.

## 2021-01-07 NOTE — Telephone Encounter (Signed)
Pt c/o BP issue: STAT if pt c/o blurred vision, one-sided weakness or slurred speech  1. What are your last 5 BP readings? 165/92, 159/87 and 149/82  2. Are you having any other symptoms (ex. Dizziness, headache, blurred vision, passed out)? Slight headache, a little dizziness   3. What is your BP issue?  Blood pressure is  high for him- he thinks his medicine needs to be adjusted  Pt c/o medication issue:  1. Name of Medication: Cardizem  2. How are you currently taking this medication (dosage and times per day)? 1 time a day  3. Are you having a reaction (difficulty breathing--STAT)? no  4. What is your medication issue? Think medicine need to be changed

## 2021-01-07 NOTE — Telephone Encounter (Signed)
Sent to [facility] for scheduling, NPR per insurance. Scheduling: [phone].

## 2021-01-12 ENCOUNTER — Telehealth (HOSPITAL_BASED_OUTPATIENT_CLINIC_OR_DEPARTMENT_OTHER): Payer: Self-pay | Admitting: Family

## 2021-01-12 DIAGNOSIS — I10 Essential (primary) hypertension: Secondary | ICD-10-CM | POA: Diagnosis not present

## 2021-01-12 DIAGNOSIS — I25118 Atherosclerotic heart disease of native coronary artery with other forms of angina pectoris: Secondary | ICD-10-CM

## 2021-01-12 NOTE — Telephone Encounter (Signed)
Pt went to 3rd floor to have blood work done and came to our office to drop off BP log. He stated he wanted to know if anything should be done because his pressures are high. Informed Joyce Gross (front desk) to let him know I will send readings to [facility], NP and we will call him.  Blood Pressures as below:  10/11 6:45 PM 145/87; 66  10/12 5:20 PM 160/82; 71  10/13 5:50 PM 140/85; 68  10/14 6:15 PM 150/77; 72  10/15 9:30 PM 150/80; 70  10/16 8:20 PM 158/83; 62  10/17 5:25 PM 148/82; 63

## 2021-01-13 ENCOUNTER — Encounter (HOSPITAL_BASED_OUTPATIENT_CLINIC_OR_DEPARTMENT_OTHER): Payer: Self-pay

## 2021-01-13 LAB — BASIC METABOLIC PANEL
BUN/Creatinine Ratio: 10 (ref 10–24)
BUN: 12 mg/dL (ref 8–27)
CO2: 23 mmol/L (ref 20–29)
Calcium: 9.3 mg/dL (ref 8.6–10.2)
Chloride: 105 mmol/L (ref 96–106)
Creatinine, Ser: 1.2 mg/dL (ref 0.76–1.27)
Glucose: 95 mg/dL (ref 70–99)
Potassium: 4.4 mmol/L (ref 3.5–5.2)
Sodium: 143 mmol/L (ref 134–144)
eGFR: 68 mL/min/{1.73_m2} (ref >59)

## 2021-01-13 LAB — SPECIMEN STATUS REPORT

## 2021-01-13 MED ORDER — LOSARTAN POTASSIUM-HCTZ 100-12.5 MG PO TABS: 1.0000 | ORAL_TABLET | Freq: Every day | ORAL | 1 refills | Status: AC

## 2021-01-13 NOTE — Telephone Encounter (Signed)
Spoke to pt and gave recommendations and lab results. Pt asked that ne Rx be sent to [facility] in [place]. Pt verbalized understanding and thanks for recommendations. Labs ordered and will be mailed to pt to repeat in 1-2 weeks. New Rx for Losartan-HCTZ 100-12.5 QD sent to requested pharmacy.

## 2021-01-13 NOTE — Addendum Note (Signed)
Addended by: Ileene Musa D on: 01/13/2021 10:38 AM   Modules accepted: Orders

## 2021-01-13 NOTE — Telephone Encounter (Signed)
Appreciate him updating [place]. I have copied recommendations from his labs below if you will reach out to him.   Labs with normal kidney function and electrolytes. BP still not at goal. Recommend stop Losartan and start Losartan-HCTZ 100-12.5mg  one tablet daily. Please provide 30 day supply as we can further up titrate dose as needed. Plan to repeat BMP in 1-2 weeks.  Thank you!!  Alver Sorrow, NP

## 2021-01-15 ENCOUNTER — Telehealth: Payer: Self-pay | Admitting: Cardiovascular Disease

## 2021-01-15 NOTE — Telephone Encounter (Signed)
Lm to call back ./cy 

## 2021-01-15 NOTE — Telephone Encounter (Signed)
Agree with recommendation to take medications in the morning, at same time each day. Check BP at least 2 hours after medications. Provide update of blood pressures middle of next week.   Alver Sorrow, NP

## 2021-01-15 NOTE — Telephone Encounter (Signed)
Left a message for the pt to call back.  

## 2021-01-15 NOTE — Telephone Encounter (Signed)
Pt is returning call regarding his BP

## 2021-01-15 NOTE — Telephone Encounter (Signed)
Spoke with the pt and he is concerned with his BP readings...  01/14/21 5:30 pm: 151/86  01/15/21 2:30 am: 179/89 01/15/21 10:08 am: 148/82  Gillian Shields NP prescribed Losartan-HCTZ 100/12.5 mg and he took a dose yesterday but has not taken one today yet... he says he read somewhere that it is best to take in the "afternoon"... I suggested he goes ahead and takes it now. He will start taking it in the morning since it does have a mild diuretic in it and he may have increased urination.   He says he has had a headache and some dizziness.. no chest pain.   I have urged him to take his meds daily and about the same time. It may take some time to notice the improvement but good to keep monitoring and right down his readings for [place].    I will forward to her for her review and any further recommendations.

## 2021-01-15 NOTE — Telephone Encounter (Signed)
Pt advised Caitlin's recommendations and verbalized understanding.

## 2021-01-15 NOTE — Telephone Encounter (Signed)
Pt c/o BP issue: STAT if pt c/o blurred vision, one-sided weakness or slurred speech  1. What are your last 5 BP readings?  01/14/21 5:30 pm: 151/86  01/15/21 2:30 am: 179/89 01/15/21 10:08 am: 148/82  2. Are you having any other symptoms (ex. Dizziness, headache, blurred vision, passed out)? Dizzy,  groggy, headache  3. What is your BP issue? Patient said he saw Gillian Shields 01/04/21. She increased the dosage of Losartan and he does not think that is helping. He is concerned because his BP is still high.  Patient wanted to come in and be seen today if possible

## 2021-01-19 DIAGNOSIS — F330 Major depressive disorder, recurrent, mild: Secondary | ICD-10-CM | POA: Diagnosis not present

## 2021-01-26 ENCOUNTER — Encounter (HOSPITAL_COMMUNITY)
Admission: RE | Admit: 2021-01-26 | Discharge: 2021-01-26 | Disposition: A | Discharge status: Home / Self Care | Payer: BLUE CROSS/BLUE SHIELD | Source: Ambulatory Visit | Attending: Neurology | Admitting: Neurology

## 2021-01-26 ENCOUNTER — Other Ambulatory Visit: Payer: Self-pay

## 2021-01-26 DIAGNOSIS — R29818 Other symptoms and signs involving the nervous system: Secondary | ICD-10-CM | POA: Insufficient documentation

## 2021-01-26 DIAGNOSIS — R251 Tremor, unspecified: Secondary | ICD-10-CM | POA: Diagnosis not present

## 2021-01-26 DIAGNOSIS — R29898 Other symptoms and signs involving the musculoskeletal system: Secondary | ICD-10-CM | POA: Diagnosis not present

## 2021-01-26 DIAGNOSIS — G20 Parkinson's disease: Secondary | ICD-10-CM | POA: Insufficient documentation

## 2021-01-26 MED ORDER — IOFLUPANE I 123 185 MBQ/2.5ML IV SOLN
5.3000 | Freq: Once | INTRAVENOUS | Status: AC | PRN
  Administered 2021-01-26: 5.3 via INTRAVENOUS
  Filled 2021-01-26: qty 10

## 2021-01-26 MED ORDER — POTASSIUM IODIDE (ANTIDOTE) 130 MG PO TABS
ORAL_TABLET | ORAL | Status: AC
  Administered 2021-01-26: 130 mg via ORAL
  Filled 2021-01-26: qty 1

## 2021-01-26 MED ORDER — POTASSIUM IODIDE (ANTIDOTE) 130 MG PO TABS: 130.0000 mg | ORAL_TABLET | Freq: Once | ORAL | Status: AC

## 2021-01-28 ENCOUNTER — Telehealth: Payer: Self-pay | Admitting: *Deleted

## 2021-01-28 MED ORDER — RASAGILINE MESYLATE 1 MG PO TABS: 1.0000 mg | ORAL_TABLET | Freq: Every day | ORAL | 3 refills | Status: DC

## 2021-01-28 NOTE — Telephone Encounter (Signed)
Relayed results to pt per Dr. Johny Sax result note message. Went over the [employer] of azilect.  He is ok to start this. [facility] in [place].  Made appt 04-27-21 at 1415 arrive 30 min for check in.  She verbalized understanding.  Will call back if questions concerns.

## 2021-01-28 NOTE — Telephone Encounter (Signed)
-----   Message from Huston Foley, MD sent at 01/27/2021 10:53 AM EDT ----- These call patient regarding the recent nuclear medicine DaT scan result. As discussed, this is a specialized brain scan designed to help with diagnosis of tremor disorders, including parkinsonian disorders. A radioactive marker gets injected and the uptake is measured in the brain and compared to normal controls and right side is compared to the left. A change in uptake can help with diagnosis of certain tremor disorders and narrow down the diagnostic possibilities. The patient's recent scan indicated abnormal (as in lower) uptake as compared to normal uptake pattern, indicating an underlying parkinsonian disorder. Again, this is not a definitive test for Parkinson's disease and does not distinguish between Parkinson's disease and other, atypical parkinsonism disorders. We can start a medication for Parkinson's such as once daily Azilect. If he would like a Rx, I can call it in. We can also arrange for a FU appt as his next convenience first to discuss.  If he is ready to proceed with the once daily medication, let me know and we will also arrange for a follow-up in about 3 months after he starts the medication.  Otherwise arrange for a follow-up first.

## 2021-01-28 NOTE — Telephone Encounter (Signed)
Spoke with the patient and discussed the new prescription for azilect. He understands the instructions and also aware of both common side effects reported and regular side effects as noted below.  He verbalized appreciation.  He did not have any questions at this time but he was encouraged to give [place] a call if he felt before his next appointment.  He understands this prescription has already been sent to the pharmacy for the full dose but again he will take 1/2 tablet every morning for 2 weeks before increasing to 1 full tablet daily.

## 2021-01-28 NOTE — Telephone Encounter (Signed)
Please advise patient to start Azilect (generic name: rasagiline) 1 mg strength: take 1/2 pill each morning for 2 weeks, then 1 whole pill each morning thereafter and ongoing. Common side effects reported are headaches, nausea, diarrhea, or constipation. Rare side effects can be confusion, personality changes, hallucinations.   Rx sent to pharmacy on file for the full dose.

## 2021-02-01 DIAGNOSIS — R509 Fever, unspecified: Secondary | ICD-10-CM | POA: Diagnosis not present

## 2021-02-01 DIAGNOSIS — Z20822 Contact with and (suspected) exposure to covid-19: Secondary | ICD-10-CM | POA: Diagnosis not present

## 2021-02-01 DIAGNOSIS — R109 Unspecified abdominal pain: Secondary | ICD-10-CM | POA: Diagnosis not present

## 2021-02-01 DIAGNOSIS — N10 Acute tubulo-interstitial nephritis: Secondary | ICD-10-CM | POA: Diagnosis not present

## 2021-02-02 DIAGNOSIS — F331 Major depressive disorder, recurrent, moderate: Secondary | ICD-10-CM | POA: Diagnosis not present

## 2021-02-03 DIAGNOSIS — N3289 Other specified disorders of bladder: Secondary | ICD-10-CM | POA: Diagnosis not present

## 2021-02-03 DIAGNOSIS — R5383 Other fatigue: Secondary | ICD-10-CM | POA: Diagnosis not present

## 2021-02-03 DIAGNOSIS — K7689 Other specified diseases of liver: Secondary | ICD-10-CM | POA: Diagnosis not present

## 2021-02-03 DIAGNOSIS — K5900 Constipation, unspecified: Secondary | ICD-10-CM | POA: Diagnosis not present

## 2021-02-03 DIAGNOSIS — I2510 Atherosclerotic heart disease of native coronary artery without angina pectoris: Secondary | ICD-10-CM | POA: Diagnosis not present

## 2021-02-03 DIAGNOSIS — N412 Abscess of prostate: Secondary | ICD-10-CM | POA: Diagnosis not present

## 2021-02-03 DIAGNOSIS — I10 Essential (primary) hypertension: Secondary | ICD-10-CM | POA: Diagnosis not present

## 2021-02-03 DIAGNOSIS — A419 Sepsis, unspecified organism: Secondary | ICD-10-CM | POA: Diagnosis not present

## 2021-02-03 DIAGNOSIS — E213 Hyperparathyroidism, unspecified: Secondary | ICD-10-CM | POA: Diagnosis not present

## 2021-02-03 DIAGNOSIS — N401 Enlarged prostate with lower urinary tract symptoms: Secondary | ICD-10-CM | POA: Diagnosis not present

## 2021-02-03 DIAGNOSIS — N179 Acute kidney failure, unspecified: Secondary | ICD-10-CM | POA: Diagnosis not present

## 2021-02-03 DIAGNOSIS — R9389 Abnormal findings on diagnostic imaging of other specified body structures: Secondary | ICD-10-CM | POA: Diagnosis not present

## 2021-02-03 DIAGNOSIS — E785 Hyperlipidemia, unspecified: Secondary | ICD-10-CM | POA: Diagnosis not present

## 2021-02-03 DIAGNOSIS — G20 Parkinson's disease: Secondary | ICD-10-CM | POA: Diagnosis not present

## 2021-02-03 DIAGNOSIS — N418 Other inflammatory diseases of prostate: Secondary | ICD-10-CM | POA: Diagnosis not present

## 2021-02-03 DIAGNOSIS — N410 Acute prostatitis: Secondary | ICD-10-CM | POA: Diagnosis not present

## 2021-02-03 DIAGNOSIS — N4289 Other specified disorders of prostate: Secondary | ICD-10-CM | POA: Diagnosis not present

## 2021-02-03 DIAGNOSIS — F515 Nightmare disorder: Secondary | ICD-10-CM | POA: Diagnosis not present

## 2021-02-03 DIAGNOSIS — E0590 Thyrotoxicosis, unspecified without thyrotoxic crisis or storm: Secondary | ICD-10-CM | POA: Diagnosis not present

## 2021-02-03 DIAGNOSIS — K6289 Other specified diseases of anus and rectum: Secondary | ICD-10-CM | POA: Diagnosis not present

## 2021-02-03 DIAGNOSIS — F4310 Post-traumatic stress disorder, unspecified: Secondary | ICD-10-CM | POA: Diagnosis not present

## 2021-02-03 DIAGNOSIS — N400 Enlarged prostate without lower urinary tract symptoms: Secondary | ICD-10-CM | POA: Diagnosis not present

## 2021-02-03 DIAGNOSIS — N3000 Acute cystitis without hematuria: Secondary | ICD-10-CM | POA: Diagnosis not present

## 2021-02-16 DIAGNOSIS — F330 Major depressive disorder, recurrent, mild: Secondary | ICD-10-CM | POA: Diagnosis not present

## 2021-02-26 DIAGNOSIS — F330 Major depressive disorder, recurrent, mild: Secondary | ICD-10-CM | POA: Diagnosis not present

## 2021-03-02 DIAGNOSIS — F330 Major depressive disorder, recurrent, mild: Secondary | ICD-10-CM | POA: Diagnosis not present

## 2021-03-16 DIAGNOSIS — F330 Major depressive disorder, recurrent, mild: Secondary | ICD-10-CM | POA: Diagnosis not present

## 2021-03-30 DIAGNOSIS — F330 Major depressive disorder, recurrent, mild: Secondary | ICD-10-CM | POA: Diagnosis not present

## 2021-04-05 ENCOUNTER — Ambulatory Visit (HOSPITAL_BASED_OUTPATIENT_CLINIC_OR_DEPARTMENT_OTHER): Payer: BLUE CROSS/BLUE SHIELD | Admitting: Family

## 2021-04-09 DIAGNOSIS — F330 Major depressive disorder, recurrent, mild: Secondary | ICD-10-CM | POA: Diagnosis not present

## 2021-04-19 DIAGNOSIS — E0590 Thyrotoxicosis, unspecified without thyrotoxic crisis or storm: Secondary | ICD-10-CM | POA: Diagnosis not present

## 2021-04-19 DIAGNOSIS — E041 Nontoxic single thyroid nodule: Secondary | ICD-10-CM | POA: Diagnosis not present

## 2021-04-22 DIAGNOSIS — E042 Nontoxic multinodular goiter: Secondary | ICD-10-CM | POA: Diagnosis not present

## 2021-04-22 DIAGNOSIS — E0590 Thyrotoxicosis, unspecified without thyrotoxic crisis or storm: Secondary | ICD-10-CM | POA: Diagnosis not present

## 2021-04-22 DIAGNOSIS — E041 Nontoxic single thyroid nodule: Secondary | ICD-10-CM | POA: Diagnosis not present

## 2021-04-23 DIAGNOSIS — I209 Angina pectoris, unspecified: Secondary | ICD-10-CM | POA: Diagnosis not present

## 2021-04-23 DIAGNOSIS — I10 Essential (primary) hypertension: Secondary | ICD-10-CM | POA: Diagnosis not present

## 2021-04-27 ENCOUNTER — Ambulatory Visit: Payer: BLUE CROSS/BLUE SHIELD | Admitting: Neurology

## 2021-04-27 DIAGNOSIS — F330 Major depressive disorder, recurrent, mild: Secondary | ICD-10-CM | POA: Diagnosis not present

## 2021-04-28 DIAGNOSIS — R339 Retention of urine, unspecified: Secondary | ICD-10-CM | POA: Diagnosis not present

## 2021-05-03 DIAGNOSIS — I10 Essential (primary) hypertension: Secondary | ICD-10-CM | POA: Diagnosis not present

## 2021-05-04 DIAGNOSIS — N401 Enlarged prostate with lower urinary tract symptoms: Secondary | ICD-10-CM | POA: Diagnosis not present

## 2021-05-04 DIAGNOSIS — R350 Frequency of micturition: Secondary | ICD-10-CM | POA: Diagnosis not present

## 2021-05-04 DIAGNOSIS — N412 Abscess of prostate: Secondary | ICD-10-CM | POA: Diagnosis not present

## 2021-05-04 DIAGNOSIS — N5314 Retrograde ejaculation: Secondary | ICD-10-CM | POA: Diagnosis not present

## 2021-05-11 DIAGNOSIS — F330 Major depressive disorder, recurrent, mild: Secondary | ICD-10-CM | POA: Diagnosis not present

## 2021-06-08 ENCOUNTER — Other Ambulatory Visit: Payer: Self-pay | Admitting: Neurology

## 2021-06-22 ENCOUNTER — Encounter: Payer: Self-pay | Admitting: Neurology

## 2021-06-22 ENCOUNTER — Ambulatory Visit: Payer: BLUE CROSS/BLUE SHIELD | Admitting: Neurology

## 2021-06-22 VITALS — BP 133/78 | HR 70 | Ht 73.0 in | Wt 221.6 lb

## 2021-06-22 DIAGNOSIS — G4752 REM sleep behavior disorder: Secondary | ICD-10-CM

## 2021-06-22 DIAGNOSIS — K5900 Constipation, unspecified: Secondary | ICD-10-CM

## 2021-06-22 DIAGNOSIS — G20 Parkinson's disease: Secondary | ICD-10-CM | POA: Diagnosis not present

## 2021-06-22 MED ORDER — ROPINIROLE HCL 0.25 MG PO TABS: 0.7500 mg | ORAL_TABLET | Freq: Three times a day (TID) | ORAL | 5 refills | Status: DC

## 2021-06-22 MED ORDER — RASAGILINE MESYLATE 1 MG PO TABS: 1.0000 mg | ORAL_TABLET | Freq: Every day | ORAL | 3 refills | Status: DC

## 2021-06-22 NOTE — Patient Instructions (Addendum)
It was nice to see you again today.  ? ?For your "active dreams", you can try Melatonin at night for sleep: take 1 mg to 3 mg, one to 2 hours before your bedtime. You can go up to 5 mg if needed and even higher in the future. It is over the counter and comes in pill form, chewable form and spray, if you prefer.   ? ?Continue with Azilect 1 mg once daily in AM. Be cautious with taking Flexeril at the same time, do not take these 2 meds together. Use the flexeril strictly as needed.  ? ?We will start at this time: Requip (generic name: ropinirole) 0.25 mg: Take one pill twice daily (morning and evening) for one week, then one pill 3 times a day (morning, lunch and evening, such as 6 AM, 12 and 6PM) for one week, then 2 pills 3 times a day for one week, then 3 pills three times a day thereafter.  ?Common side effects reported are: Sedation, sleepiness, nausea, vomiting, and rare side effects are confusion, hallucinations, swelling in legs, and abnormal behaviors, including impulse control problems, which can manifest as excessive eating, obsessions with food or gambling, or hypersexuality. ? ?We will plan a follow up in about 3-4 months, call or email through MyChart for interim questions or concerns.  ?

## 2021-06-22 NOTE — Progress Notes (Signed)
Subjective:  ?  ?Patient ID: [name] is a 66 y.o. male. ? ?HPI ? ? ? ?Interim history:  ? ?[name] is a 66 year old right-handed gentleman with an underlying medical history of coronary artery disease, hypertension, hyperlipidemia, bradycardia, cervical spinal stenosis, reflux disease, history of presyncope, and overweight state, who presents for FU consultation of his parkinsonism, after a recent DaT scan. The patient is unaccompanied today.  I first met him at the request of his primary care nurse practitioner on 12/30/2020, at which time he reported an approximately 1 year history of hand tremors, primarily on the left side.  He had also noticed some changes with his balance.  Examination revealed mild changes in keeping with parkinsonism with a left-sided predominance.  He was advised to proceed with a DaTscan and we talked about utilizing symptomatic medication in the near future.   ? ?He had a DaTscan on 01/26/2021 and I reviewed the results: IMPRESSION: ?Decreased radiotracer activity within the posterior striata is a ?pattern typical Parkinsonian syndrome pathology. ?  ?Of note, DaTSCAN is not diagnostic of Parkinsonian syndromes, which ?remains a clinical diagnosis. DaTscan is an adjuvant test to aid in ?the clinical diagnosis of Parkinsonian syndromes. ? ?He was notified of the test results and we started Azilect in November 2022. ? ?Today, 06/22/2021: He reports feeling about the same.  He is able to tolerate the Azilect and takes it first thing in the morning.  He is bothered by low back pain from time to time.  He works from 730 to 4:30 PM, he works in the [place] at a [employer].  He has had occasional constipation.  In the past few months he has had some dream activity.  His wife has reported that he yells out in his sleep, he has also flailed his arms and legs.  He has accidentally hit her, no injuries reported thankfully, he has not injured himself or fallen out of bed.  His tremor  is perhaps a little bit less noticeable since he started the Azilect. ? ? ?The patient's allergies, current medications, family history, past medical history, past social history, past surgical history and problem list were reviewed and updated as appropriate.  ? ?Previously:  ? ?12/30/20: (He) reports an approximately 1 year history of bilateral hand tremors, tremors are intermittent and primarily left-sided, primarily when he holds something that has some heft to it.  He has noticed some discomfort in his left knee area, almost like he strained it while weightlifting.  He feels stiff in the left hand, sometimes even feels like he has to pry the fingers open.  He has not fallen but has noticed some changes in his balance especially on uneven ground.  He works at a Agricultural [employer].  He is married and lives with his wife, he has 2 grown children, daughter lives in [place], his younger son is nearly 30 years old and lives with, he works as an Air traffic controller.  Patient is a non-smoker and does not utilize alcohol, he drinks caffeine occasionally in the form of coffee, not daily.  He has no family history of Parkinson's disease.  He had a total of 1 brother and 4 sisters, he has 3 sisters alive.  He had neck surgery in June 2017 at [facility]. ?  ?I reviewed your office note from 10/09/2020.  He had blood work at the time including TSH and vitamin B12, CBC, CMP, iron studies, lipid profile, PSA.  I was able to review  his blood test results in care everywhere.  Vitamin B12 was on the lower end of the spectrum at 331, chemistry panel showed benign results, creatinine 1.11, BUN 12, TSH was 2.08.  Lipid panel showed total cholesterol of 121, LDL 59, triglycerides 69, HDL 47. ? ?His Past Medical History Is Significant For: ?Past Medical History:  ?Diagnosis Date  ? Gallstones   ? Gastric polyps   ? GERD (gastroesophageal reflux disease)   ? H/O iron deficiency anemia   ? HLD (hyperlipidemia)   ? Hypertension   ?  Hyperthyroidism   ? 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 Increased upper airway resistance syndrome, definitive sleep apnea not demonstrated.  His AHI was 0.4; however, his RDI was 8.2.  He had an increased arousals.  There was no evidence for nocturnal myoclonus.  ? S/P cardiac cath 01/29/2014  ?  but with area of kinking obstructing the LCX to 50%. other wise patent coronary arteries.  ? Sinus bradycardia 2015  ? Event monitor showed no critical bradycardia, tolerating low-dose beta blocker  ? Spinal stenosis   ? ? ?His Past Surgical History Is Significant For: ?Past Surgical History:  ?Procedure Laterality Date  ? CHOLECYSTECTOMY    ? LEFT HEART CATHETERIZATION WITH CORONARY ANGIOGRAM N/A 01/29/2014  ? Procedure: LEFT HEART CATHETERIZATION WITH CORONARY ANGIOGRAM;  Surgeon: Belva Crome III, MD; patent coronary arteries  but with area of kinking obstructing the LCX to 50%. EF nl      ? NECK SURGERY Left   ? titanium plate with screws  ? ? ?His Family History Is Significant For: ?Family History  ?Problem Relation Age of Onset  ? Diabetes Mother   ? Heart failure Father   ? Hypertension Father   ? Pancreatic cancer Father   ? Heart Problems Sister   ? Lung cancer Sister   ? Stroke Brother   ? Tremor Neg Hx   ? Parkinson's disease Neg Hx   ? ? ?His Social History Is Significant For: ?Social History  ? ?Socioeconomic History  ? Marital status: Married  ?  Spouse name: Not on file  ? Number of children: 2  ? Years of education: Not on file  ? Highest education level: Not on file  ?Occupational History  ? Occupation: retired Engineer, structural  ? Occupation: [employer]  ?Tobacco Use  ? Smoking status: Never  ? Smokeless tobacco: Never  ?Vaping Use  ? Vaping Use: Never used  ?Substance and Sexual Activity  ? Alcohol use: No  ? Drug use: No  ? Sexual activity: Yes  ?Other Topics Concern  ? Not on file  ?Social History Narrative  ? He is married he has 3 sons 2 daughters  ? He  works as a Training and development officer for [employer], he is a retired Engineer, structural  ? No alcohol tobacco or drug use  ? ?Social Determinants of Health  ? ?Financial Resource Strain: Not on file  ?Food Insecurity: Not on file  ?Transportation Needs: Not on file  ?Physical Activity: Not on file  ?Stress: Not on file  ?Social Connections: Not on file  ? ? ?His Allergies Are:  ?Allergies  ?Allergen Reactions  ? Atorvastatin Other (See Comments) and Nausea And Vomiting  ?  States he had to go seek medical care secondary to degree of Abd discomfort  ?  ? Enalapril Cough  ? Lyrica [Pregabalin] Palpitations  ? Neurontin [Gabapentin] Palpitations  ? Prednisone Nausea Only  ?:  ? ?  His Current Medications Are:  ?Outpatient Encounter Medications as of 06/22/2021  ?Medication Sig  ? cyclobenzaprine (FLEXERIL) 10 MG tablet Take 10 mg by mouth as needed for muscle spasms.  ? diltiazem (CARDIZEM CD) 240 MG 24 hr capsule Take 1 capsule (240 mg total) by mouth daily.  ? losartan-hydrochlorothiazide (HYZAAR) 100-12.5 MG tablet Take 1 tablet by mouth daily.  ? methimazole (TAPAZOLE) 5 MG tablet Take 5 mg by mouth daily.  ? oxymetazoline (AFRIN NASAL SPRAY) 0.05 % nasal spray Afrin (oxymetazoline) 0.05 % nasal spray ? 2 sprays in left nostril today, then as needed 2x a day if nose bleed.  ? rasagiline (AZILECT) 1 MG TABS tablet TAKE 1 TABLET BY MOUTH DAILY. FOLLOW TITRATION INTRUCTIONS PROVDED SEPARATELY  ? rosuvastatin (CRESTOR) 40 MG tablet Take 40 mg by mouth daily.  ? ?No facility-administered encounter medications on file as of 06/22/2021.  ?: ? ?Review of Systems:  ?Out of a complete 14 point review of systems, all are reviewed and negative with the exception of these symptoms as listed below: ? ?Review of Systems  ?Neurological:   ?     Pt is here today to go over his DATScan results  Pt states he has no questions or concerns for today's visit   ? ?Objective:  ?Neurological Exam ? ?Physical Exam ?Physical Examination:  ? ?Vitals:   ? 06/22/21 0719  ?BP: 133/78  ?Pulse: 70  ? ? ?General Examination: The patient is a very pleasant 66 y.o. male in no acute distress. He appears well-developed and well-nourished and well groomed.  ? ?HEEN

## 2021-08-30 DIAGNOSIS — R350 Frequency of micturition: Secondary | ICD-10-CM | POA: Diagnosis not present

## 2021-08-30 DIAGNOSIS — R39198 Other difficulties with micturition: Secondary | ICD-10-CM | POA: Diagnosis not present

## 2021-08-30 DIAGNOSIS — N401 Enlarged prostate with lower urinary tract symptoms: Secondary | ICD-10-CM | POA: Diagnosis not present

## 2021-08-30 DIAGNOSIS — N412 Abscess of prostate: Secondary | ICD-10-CM | POA: Diagnosis not present

## 2021-09-23 ENCOUNTER — Encounter: Payer: Self-pay | Admitting: Neurology

## 2021-09-23 ENCOUNTER — Ambulatory Visit: Payer: BLUE CROSS/BLUE SHIELD | Admitting: Neurology

## 2021-09-23 VITALS — BP 143/75 | HR 72 | Ht 73.0 in | Wt 220.0 lb

## 2021-09-23 DIAGNOSIS — G20 Parkinson's disease: Secondary | ICD-10-CM | POA: Diagnosis not present

## 2021-09-23 DIAGNOSIS — K5900 Constipation, unspecified: Secondary | ICD-10-CM | POA: Diagnosis not present

## 2021-09-23 DIAGNOSIS — G4752 REM sleep behavior disorder: Secondary | ICD-10-CM

## 2021-09-23 MED ORDER — ROPINIROLE HCL 1 MG PO TABS: 1.0000 mg | ORAL_TABLET | Freq: Three times a day (TID) | ORAL | 3 refills | Status: DC

## 2021-09-23 NOTE — Progress Notes (Signed)
Subjective:    Patient ID: [name] is a 66 y.o. male.  HPI    Interim history:   [name] is a 66 year old right-handed [name] with an underlying medical history of coronary artery disease, hypertension, hyperlipidemia, bradycardia, cervical spinal stenosis, reflux disease, history of presyncope, and overweight state, who presents for FU consultation of [name] parkinsonism.   I last saw [name] on 06/22/2021, 06/22/2021, at which time [name] felt stable.  [name] was on Azilect and tolerated the medication.  [name] DaTscan from 01/26/2021 was supportive for an underlying parkinsonian syndrome with decreased radiotracer activity within the posterior striata.  [name] was advised to start low-dose ropinirole at 0.25 mg strength twice daily with gradual titration up to 0.75 mg 3 times daily over the next ensuing weeks. [name] was also advised to start melatonin for dream enactment behavior.   Today, 09/23/2021: [name] reports feeling slightly better, [name] is able to tolerate the ropinirole, no significant side effects, in particular, no serious degree of sleepiness or nausea or vomiting.  [name] continues to take Azilect in the mornings.  [name] tremor flares up from time to time.  [name] has not fallen.  [name] tries to hydrate well, constipation is a little better since [name] has been hydrating better.  [name] sleeps fairly well, [name] tried melatonin fairly consistently after we last met for about 3 weeks, [name] feels that [name] dream enactment behavior is not as frequent but currently does not take the melatonin either.  [name] gets quite a bit of walking done while at work but does not have any formal exercise routine or regimen right now.  [name] used to use the treadmill fairly consistently but then started having low back pain.  [name] is motivated to get back on track with some form of exercise.  [name] does like working in the yard.    The patient's allergies, current medications, family history, past medical history, past social history, past surgical history and  problem list were reviewed and updated as appropriate.    Previously:   I first met [name] at the request of [name] primary care nurse practitioner on 12/30/2020, at which time [name] reported an approximately 1 year history of hand tremors, primarily on the left side.  [name] had also noticed some changes with [name] balance.  Examination revealed mild changes in keeping with parkinsonism with a left-sided predominance.  [name] was advised to proceed with a DaTscan and we talked about utilizing symptomatic medication in the near future.     [name] had a DaTscan on 01/26/2021 and I reviewed the results: IMPRESSION: Decreased radiotracer activity within the posterior striata is a pattern typical Parkinsonian syndrome pathology.   Of note, DaTSCAN is not diagnostic of Parkinsonian syndromes, which remains a clinical diagnosis. DaTscan is an adjuvant test to aid in the clinical diagnosis of Parkinsonian syndromes.   [name] was notified of the test results and we started Azilect in November 2022.       12/30/20: ([name]) reports an approximately 1 year history of bilateral hand tremors, tremors are intermittent and primarily left-sided, primarily when [name] holds something that has some heft to it.  [name] has noticed some discomfort in [name] left knee area, almost like [name] strained it while weightlifting.  [name] feels stiff in the left hand, sometimes even feels like [name] has to pry the fingers open.  [name] has not fallen but has noticed some changes in [name] balance especially on uneven ground.  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  Patient is a non-smoker and does not utilize alcohol, [name] drinks caffeine occasionally in the form of coffee, not daily.  [name] has no family history of Parkinson's disease.  [name] had a total of 1 brother and 4 sisters, [name] has 3 sisters alive.  [name]  had neck surgery in June 2017 at [facility].   I reviewed your office note from 10/09/2020.  [name] had blood work at the time including TSH and vitamin B12, CBC, CMP, iron studies, lipid profile, PSA.  I was able to review [name] blood test results in care everywhere.  Vitamin B12 was on the lower end of the spectrum at 331, chemistry panel showed benign results, creatinine 1.11, BUN 12, TSH was 2.08.  Lipid panel showed total cholesterol of 121, LDL 59, triglycerides 69, HDL 47.   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  [name] AHI was 0.4; however, [name] RDI was 8.2.  [name] had an increased arousals.  There was no evidence for nocturnal myoclonus.   S/P cardiac cath 01/29/2014    but with area of kinking obstructing the LCX to 50%. other wise patent coronary arteries.   Sinus bradycardia 2015   Event monitor showed no critical bradycardia, tolerating low-dose beta blocker   Spinal stenosis     [name] Past Surgical History Is Significant For: Past Surgical History:  Procedure Laterality Date   CHOLECYSTECTOMY     LEFT HEART CATHETERIZATION WITH CORONARY ANGIOGRAM N/A 01/29/2014   Procedure: LEFT HEART CATHETERIZATION WITH CORONARY ANGIOGRAM;  Surgeon: Belva Crome III, MD; patent coronary arteries  but with area of kinking obstructing the LCX to 50%. EF nl       NECK SURGERY Left    titanium plate with screws    [name] Family History Is Significant For: Family History  Problem Relation Age of Onset   Diabetes Mother    Heart failure Father    Hypertension Father    Pancreatic cancer Father    Heart Problems Sister    Lung cancer Sister     Stroke Brother    Tremor Neg Hx    Parkinson's disease Neg Hx     [name] Social History Is Significant For: Social History   Socioeconomic History   Marital status: Married    Spouse name: Not on file   Number of children: 2   Years of education: Not on file   Highest education level: Not on file  Occupational History   Occupation: retired Engineer, structural   Occupation: [employer]  Tobacco Use   Smoking status: Never   Smokeless tobacco: Never  Vaping Use   Vaping Use: Never used  Substance and Sexual Activity   Alcohol use: No   Drug use: No   Sexual activity: Yes  Other Topics Concern   Not on file  Social History Narrative   [name] is married [name] has 3 sons 2 daughters   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[employer], 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, structural   No alcohol tobacco or drug use   Social Determinants of Sales executive: Not on Art therapist Insecurity: Not on file  Transportation Needs: Not on file  Physical Activity: Not on file  Stress: Not on file  Social Connections: Not on file    [name] Allergies Are:  Allergies  Allergen Reactions   Atorvastatin Other (See Comments) and Nausea And Vomiting    States [name] had to go seek medical care secondary to degree of Abd discomfort     Enalapril Cough   Lyrica [Pregabalin] Palpitations   Neurontin [Gabapentin] Palpitations   Prednisone Nausea Only  :   [name] Current Medications Are:  Outpatient Encounter Medications as of 09/23/2021  Medication Sig   diltiazem (CARDIZEM CD) 240 MG 24 hr capsule Take 1 capsule (240 mg total) by mouth daily.   losartan-hydrochlorothiazide (HYZAAR) 100-12.5 MG tablet Take 1 tablet by mouth daily.   methimazole (TAPAZOLE) 5 MG tablet Take 5 mg by mouth daily.   oxymetazoline (AFRIN NASAL SPRAY) 0.05 % nasal spray Afrin (oxymetazoline) 0.05 % nasal spray  2 sprays in left nostril today, then as needed 2x a day if nose bleed.   rasagiline (AZILECT) 1 MG TABS  tablet Take 1 tablet (1 mg total) by mouth daily.   rOPINIRole (REQUIP) 0.25 MG tablet Take 3 tablets (0.75 mg total) by mouth 3 (three) times daily. This is the full dose, follow titration instructions provided.   rosuvastatin (CRESTOR) 40 MG tablet Take 40 mg by mouth daily.   cyclobenzaprine (FLEXERIL) 10 MG tablet Take 10 mg by mouth as needed for muscle spasms.   No facility-administered encounter medications on file as of 09/23/2021.  :  Review of Systems:  Out of a complete 14 point review of systems, all are reviewed and negative with the exception of these symptoms as listed below:  Review of Systems  Neurological:        Pt is here for parkinson's follow up  pt states [name] has no questions or concerns for today's visit     Objective:  Neurological Exam  Physical Exam Physical Examination:   Vitals:   09/23/21 1407  BP: (!) 143/75  Pulse: 72    General Examination: The patient is a very pleasant 67 y.o. male in no acute distress. [name] appears well-developed and well-nourished and well groomed.   HEENT: Normocephalic, atraumatic, pupils are equal, round and reactive to light, extraocular tracking is overall well-preserved, hearing grossly intact, face is symmetric with mild to moderate facial masking, no obvious facial or neck tremor.  [name] has a mild decrease in blink rate.  [name] has very mild hypophonia, no dysarthria.  Mild to moderate nuchal rigidity.  Airway examination reveals stable findings.  Tongue protrudes centrally and palate elevates symmetrically, no carotid bruits.     Chest: Clear to auscultation without wheezing, rhonchi or crackles noted.   Heart: S1+S2+0, regular and normal without murmurs, rubs or gallops noted.    Abdomen: Soft, non-tender and non-distended.   Extremities: There is no pitting edema in the distal lower extremities bilaterally.    Skin: Warm and dry without trophic changes noted.    Musculoskeletal: exam reveals no obvious joint  deformities.    Neurologically:  Mental status: The patient is awake, alert and oriented in all 4 spheres. [name] immediate and remote memory, attention, language skills and fund of knowledge are  appropriate. There is no evidence of aphasia, agnosia, apraxia or anomia. Speech as above.  Thought process is linear, mood is congruent, affect is normal to slightly blunted.    Cranial nerves II - XII are as described above under HEENT exam.    Motor exam: Normal bulk, and strength.  [name] has a mildly increased tone in both upper extremities, intermittent resting tremor in the left hand. [name] has no other resting tremor.  [name] has no significant postural or action tremor or intention tremor, with the exception of a slight postural tremor with holding something with the left hand.   (On 12/30/2020: On Archimedes spiral drawing [name] has no significant trembling with either hand, handwriting with the right hand is not tremulous, neat and legible, not particularly micrographic.)   Fine motor testing, [name] has normal finger taps and hand movements as well as rapid alternating patting with the right hand, with the left hand [name] has mild impairment with the finger taps, hand movements a slightly slower.  Overall [name] has very mild bradykinesia.  [name] has no freezing episodes.  [name] has mild difficulty with foot taps on the left, fairly normal on the right.  Cerebellar testing: No dysmetria or intention tremor.  There is no truncal or gait ataxia.  Sensory exam: intact to light touch in the upper and lower extremities.  Gait, station and balance: [name] stands without difficulty, posture is mildly stooped for age.  [name] walks with mild decrease in arm swing on the left and more noticeable tremor in the left hand with walking.  No shuffling noted.  [name] turns slightly slowly but without difficulty otherwise.  Balance is well-preserved.     Assessment and Plan:    In summary, Sevyn Paredez is a very pleasant 66 year old male with an underlying  medical history of coronary artery disease, hypertension, hyperlipidemia, bradycardia, cervical spinal stenosis, reflux disease, history of presyncope, and overweight state, who presents for follow-up consultation of [name] parkinsonism with left-sided predominance noted.  Symptoms have been ongoing for over one year and a half.  [name] had a DaTscan in November 2022 which showed decreased radiotracer activity within the posterior striata bilaterally and - as such - supported an underlying parkinsonian syndrome.  [name] has been on Azilect since November 2022.  [name] tolerates the medication.  We started ropinirole in low-dose in March 2023.  [name] is currently taking 0.75 mg 3 times daily and tolerates the medication.  I suggested we increase this to 1 mg strength 1 pill 3 times daily at this time.  [name] is agreeable. [name] is advised to consider taking melatonin more consistently at night should [name] have increase in frequency or in severity of [name] dream enactment behavior.  We may consider something like clonazepam in the future. We talked about the importance of maintaining a healthy lifestyle, good nutrition and good hydration, good activity physically and mentally.  [name] constipation is better.  [name] is encouraged to try to exercise on a regular basis.  [name] is advised to follow-up to see one of our nurse practitioners routinely in 6 months, sooner if needed.  I answered all [name] questions today and [name] was in agreement.  I spent 30 minutes in total face-to-face time and in reviewing records during pre-charting, more than 50% of which was spent in counseling and coordination of care, reviewing test results, reviewing medications and treatment regimen and/or in discussing or reviewing the diagnosis of primary parkinsonism, the prognosis and treatment options. Pertinent laboratory and imaging  test results that were available during this visit with the patient were reviewed by me and considered in my medical decision making (see chart for  details).

## 2021-10-15 DIAGNOSIS — I2510 Atherosclerotic heart disease of native coronary artery without angina pectoris: Secondary | ICD-10-CM | POA: Diagnosis not present

## 2021-10-15 DIAGNOSIS — E782 Mixed hyperlipidemia: Secondary | ICD-10-CM | POA: Diagnosis not present

## 2021-10-15 DIAGNOSIS — R001 Bradycardia, unspecified: Secondary | ICD-10-CM | POA: Diagnosis not present

## 2021-10-15 DIAGNOSIS — I10 Essential (primary) hypertension: Secondary | ICD-10-CM | POA: Diagnosis not present

## 2021-10-18 DIAGNOSIS — E041 Nontoxic single thyroid nodule: Secondary | ICD-10-CM | POA: Diagnosis not present

## 2021-10-18 DIAGNOSIS — E0590 Thyrotoxicosis, unspecified without thyrotoxic crisis or storm: Secondary | ICD-10-CM | POA: Diagnosis not present

## 2021-10-30 DIAGNOSIS — F330 Major depressive disorder, recurrent, mild: Secondary | ICD-10-CM | POA: Diagnosis not present

## 2021-11-10 DIAGNOSIS — N4601 Organic azoospermia: Secondary | ICD-10-CM | POA: Diagnosis not present

## 2021-11-11 DIAGNOSIS — F330 Major depressive disorder, recurrent, mild: Secondary | ICD-10-CM | POA: Diagnosis not present

## 2021-11-27 DIAGNOSIS — F4321 Adjustment disorder with depressed mood: Secondary | ICD-10-CM | POA: Diagnosis not present

## 2021-12-11 DIAGNOSIS — F4321 Adjustment disorder with depressed mood: Secondary | ICD-10-CM | POA: Diagnosis not present

## 2021-12-25 DIAGNOSIS — F4321 Adjustment disorder with depressed mood: Secondary | ICD-10-CM | POA: Diagnosis not present

## 2022-01-08 DIAGNOSIS — F4321 Adjustment disorder with depressed mood: Secondary | ICD-10-CM | POA: Diagnosis not present

## 2022-01-14 DIAGNOSIS — N3289 Other specified disorders of bladder: Secondary | ICD-10-CM | POA: Diagnosis not present

## 2022-01-14 DIAGNOSIS — N35911 Unspecified urethral stricture, male, meatal: Secondary | ICD-10-CM | POA: Diagnosis not present

## 2022-02-05 DIAGNOSIS — F4321 Adjustment disorder with depressed mood: Secondary | ICD-10-CM | POA: Diagnosis not present

## 2022-02-25 DIAGNOSIS — E785 Hyperlipidemia, unspecified: Secondary | ICD-10-CM | POA: Diagnosis not present

## 2022-02-25 DIAGNOSIS — Z125 Encounter for screening for malignant neoplasm of prostate: Secondary | ICD-10-CM | POA: Diagnosis not present

## 2022-02-25 DIAGNOSIS — Z0001 Encounter for general adult medical examination with abnormal findings: Secondary | ICD-10-CM | POA: Diagnosis not present

## 2022-02-25 DIAGNOSIS — Z131 Encounter for screening for diabetes mellitus: Secondary | ICD-10-CM | POA: Diagnosis not present

## 2022-03-02 DIAGNOSIS — Z0001 Encounter for general adult medical examination with abnormal findings: Secondary | ICD-10-CM | POA: Diagnosis not present

## 2022-03-02 DIAGNOSIS — Z1211 Encounter for screening for malignant neoplasm of colon: Secondary | ICD-10-CM | POA: Diagnosis not present

## 2022-03-02 DIAGNOSIS — Z125 Encounter for screening for malignant neoplasm of prostate: Secondary | ICD-10-CM | POA: Diagnosis not present

## 2022-03-02 DIAGNOSIS — Z23 Encounter for immunization: Secondary | ICD-10-CM | POA: Diagnosis not present

## 2022-03-05 DIAGNOSIS — F4321 Adjustment disorder with depressed mood: Secondary | ICD-10-CM | POA: Diagnosis not present

## 2022-03-26 DIAGNOSIS — F4321 Adjustment disorder with depressed mood: Secondary | ICD-10-CM | POA: Diagnosis not present

## 2022-04-20 ENCOUNTER — Ambulatory Visit: Payer: BLUE CROSS/BLUE SHIELD | Admitting: Adult Health

## 2022-04-20 DIAGNOSIS — E041 Nontoxic single thyroid nodule: Secondary | ICD-10-CM | POA: Diagnosis not present

## 2022-04-20 DIAGNOSIS — E0590 Thyrotoxicosis, unspecified without thyrotoxic crisis or storm: Secondary | ICD-10-CM | POA: Diagnosis not present

## 2022-04-30 DIAGNOSIS — F4321 Adjustment disorder with depressed mood: Secondary | ICD-10-CM | POA: Diagnosis not present

## 2022-05-14 DIAGNOSIS — F4321 Adjustment disorder with depressed mood: Secondary | ICD-10-CM | POA: Diagnosis not present

## 2022-05-25 NOTE — Progress Notes (Unsigned)
PATIENT: [name] DOB: [birth year]  REASON FOR VISIT: follow up HISTORY FROM: patient PRIMARY NEUROLOGIST:   HISTORY OF PRESENT ILLNESS: Today 05/25/22  [name] is a 67 y.o. male who has been followed in this office for primary parkinsonism. Returns today for follow-up.  On Requip and Azilect  HISTORY 09/23/2021: He reports feeling slightly better, he is able to tolerate the ropinirole, no significant side effects, in particular, no serious degree of sleepiness or nausea or vomiting.  He continues to take Azilect in the mornings.  His tremor flares up from time to time.  He has not fallen.  He tries to hydrate well, constipation is a little better since he has been hydrating better.  He sleeps fairly well, he tried melatonin fairly consistently after we last met for about 3 weeks, he feels that his dream enactment behavior is not as frequent but currently does not take the melatonin either.  He gets quite a bit of walking done while at work but does not have any formal exercise routine or regimen right now.  He used to use the treadmill fairly consistently but then started having low back pain.  He is motivated to get back on track with some form of exercise.  He does like working in the yard.     REVIEW OF SYSTEMS: Out of a complete 14 system review of symptoms, the patient complains only of the following symptoms, and all other reviewed systems are negative.  ALLERGIES: Allergies  Allergen Reactions   Atorvastatin Other (See Comments) and Nausea And Vomiting    States he had to go seek medical care secondary to degree of Abd discomfort     Enalapril Cough   Lyrica [Pregabalin] Palpitations   Neurontin [Gabapentin] Palpitations   Prednisone Nausea Only    HOME MEDICATIONS: Outpatient Medications Prior to Visit  Medication Sig Dispense Refill   cyclobenzaprine (FLEXERIL) 10 MG tablet Take 10 mg by mouth as needed for muscle spasms.     diltiazem (CARDIZEM CD) 240 MG 24 hr  capsule Take 1 capsule (240 mg total) by mouth daily. 90 capsule 1   losartan-hydrochlorothiazide (HYZAAR) 100-12.5 MG tablet Take 1 tablet by mouth daily. 30 tablet 1   methimazole (TAPAZOLE) 5 MG tablet Take 5 mg by mouth daily.     oxymetazoline (AFRIN NASAL SPRAY) 0.05 % nasal spray Afrin (oxymetazoline) 0.05 % nasal spray  2 sprays in left nostril today, then as needed 2x a day if nose bleed.     rasagiline (AZILECT) 1 MG TABS tablet Take 1 tablet (1 mg total) by mouth daily. 90 tablet 3   rOPINIRole (REQUIP) 1 MG tablet Take 1 tablet (1 mg total) by mouth 3 (three) times daily. Please note change in dose. 270 tablet 3   rosuvastatin (CRESTOR) 40 MG tablet Take 40 mg by mouth daily.     No facility-administered medications prior to visit.    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  His AHI was 0.4; however, his RDI was 8.2.  He had an increased arousals.  There was no evidence for nocturnal myoclonus.   S/P cardiac cath 01/29/2014  but with area of kinking obstructing the LCX to 50%. other wise patent coronary arteries.   Sinus bradycardia 2015   Event monitor showed no critical bradycardia, tolerating low-dose beta blocker   Spinal stenosis     PAST SURGICAL HISTORY: Past Surgical History:  Procedure Laterality Date   CHOLECYSTECTOMY     LEFT HEART CATHETERIZATION WITH CORONARY ANGIOGRAM N/A 01/29/2014   Procedure: LEFT HEART CATHETERIZATION WITH CORONARY ANGIOGRAM;  Surgeon: Belva Crome III, MD; patent coronary arteries  but with area of kinking obstructing the LCX to 50%. EF nl       NECK SURGERY Left    titanium plate with screws     FAMILY HISTORY: Family History  Problem Relation Age of Onset   Diabetes Mother    Heart failure Father    Hypertension Father    Pancreatic cancer Father    Heart Problems Sister    Lung cancer Sister    Stroke Brother    Tremor Neg Hx    Parkinson's disease Neg Hx     SOCIAL HISTORY: Social History   Socioeconomic History   Marital status: Married    Spouse name: Not on file   Number of children: 2   Years of education: Not on file   Highest education level: Not on file  Occupational History   Occupation: retired Engineer, structural   Occupation: [employer]  Tobacco Use   Smoking status: Never   Smokeless tobacco: Never  Scientific laboratory technician Use: Never used  Substance and Sexual Activity   Alcohol use: No   Drug use: No   Sexual activity: Yes  Other Topics Concern   Not on file  Social History Narrative   He is married he has 3 sons 2 daughters   He works as a Training and development officer for [employer], he is a retired Engineer, structural   No alcohol tobacco or drug use   Social Determinants of Radio broadcast assistant Strain: Not on Art therapist Insecurity: Not on file  Transportation Needs: Not on file  Physical Activity: Not on file  Stress: Not on file  Social Connections: Not on file  Intimate Partner Violence: Not on file      PHYSICAL EXAM  There were no vitals filed for this visit. There is no height or weight on file to calculate BMI.  Generalized: Well developed, in no acute distress   Neurological examination  Mentation: Alert oriented to time, place, history taking. Follows all commands speech and language fluent Cranial nerve II-XII: Pupils were equal round reactive to light. Extraocular movements were full, visual field were full on confrontational test. Facial sensation and strength were normal. Uvula tongue midline. Head turning and shoulder shrug  were normal and symmetric. Motor: The motor testing reveals 5 over 5 strength of  all 4 extremities. Good symmetric motor tone is noted throughout.  Sensory: Sensory testing is intact to soft touch on all 4 extremities. No evidence of extinction is noted.  Coordination: Cerebellar testing reveals good finger-nose-finger and heel-to-shin bilaterally.  Gait and station: Gait is normal. Tandem gait is normal. Romberg is negative. No drift is seen.  Reflexes: Deep tendon reflexes are symmetric and normal bilaterally.   DIAGNOSTIC DATA (LABS, IMAGING, TESTING) - I reviewed patient records, labs, notes, testing and imaging myself where available.  Lab Results  Component Value Date   WBC 5.6 01/29/2020   HGB 14.0 01/29/2020   HCT 42.6 01/29/2020   MCV  85 01/29/2020   PLT 214 01/29/2020      Component Value Date/Time   NA 143 01/12/2021 0000   K 4.4 01/12/2021 0000   CL 105 01/12/2021 0000   CO2 23 01/12/2021 0000   GLUCOSE 95 01/12/2021 0000   GLUCOSE 89 06/15/2016 1031   BUN 12 01/12/2021 0000   CREATININE 1.20 01/12/2021 0000   CREATININE 1.38 (H) 06/15/2016 1031   CALCIUM 9.3 01/12/2021 0000   PROT 7.3 01/29/2020 0940   ALBUMIN 4.5 01/29/2020 0940   AST 28 01/29/2020 0940   ALT 25 01/29/2020 0940   ALKPHOS 113 01/29/2020 0940   BILITOT 0.4 01/29/2020 0940   GFRNONAA 78 01/29/2020 0940   GFRAA 91 01/29/2020 0940   Lab Results  Component Value Date   CHOL 115 01/29/2020   HDL 46 01/29/2020   LDLCALC 56 01/29/2020   TRIG 61 01/29/2020   CHOLHDL 2.5 01/29/2020   No results found for: "HGBA1C" No results found for: "VITAMINB12" Lab Results  Component Value Date   TSH 0.97 06/15/2016      ASSESSMENT AND PLAN 67 y.o. 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 here with:  1.  Primary parkinsonism  Continue Requip Continue Azilect     Ward Givens, MSN, NP-C 05/25/2022, 4:02 PM [facility] [address], [place], [place] [address] [phone]

## 2022-05-26 ENCOUNTER — Ambulatory Visit: Payer: BC Managed Care – PPO | Admitting: Adult Health

## 2022-05-26 ENCOUNTER — Encounter: Payer: Self-pay | Admitting: Adult Health

## 2022-05-26 VITALS — BP 156/86 | HR 72 | Ht 73.0 in | Wt 225.0 lb

## 2022-05-26 DIAGNOSIS — G20C Parkinsonism, unspecified: Secondary | ICD-10-CM | POA: Diagnosis not present

## 2022-05-30 DIAGNOSIS — E785 Hyperlipidemia, unspecified: Secondary | ICD-10-CM | POA: Diagnosis not present

## 2022-05-30 DIAGNOSIS — I10 Essential (primary) hypertension: Secondary | ICD-10-CM | POA: Diagnosis not present

## 2022-05-30 DIAGNOSIS — M545 Low back pain, unspecified: Secondary | ICD-10-CM | POA: Diagnosis not present

## 2022-05-30 DIAGNOSIS — R1319 Other dysphagia: Secondary | ICD-10-CM | POA: Diagnosis not present

## 2022-06-11 DIAGNOSIS — F4321 Adjustment disorder with depressed mood: Secondary | ICD-10-CM | POA: Diagnosis not present

## 2022-07-01 ENCOUNTER — Other Ambulatory Visit: Payer: Self-pay | Admitting: Neurology

## 2022-07-05 DIAGNOSIS — I10 Essential (primary) hypertension: Secondary | ICD-10-CM | POA: Diagnosis not present

## 2022-07-06 ENCOUNTER — Telehealth: Payer: Self-pay | Admitting: Adult Health

## 2022-07-06 NOTE — Telephone Encounter (Signed)
Pt called stating that he is needing a PA for his  rasagiline (AZILECT) 1 MG TABS tablet Please advise.

## 2022-07-07 ENCOUNTER — Other Ambulatory Visit (HOSPITAL_COMMUNITY): Payer: Self-pay

## 2022-07-07 NOTE — Telephone Encounter (Signed)
I called pharmacy, pt gets generic .  He does not need a PA.

## 2022-07-07 NOTE — Telephone Encounter (Signed)
Can you verify if this is for the [facility] or generic. Thanks!

## 2022-07-15 DIAGNOSIS — H40003 Preglaucoma, unspecified, bilateral: Secondary | ICD-10-CM | POA: Diagnosis not present

## 2022-07-23 DIAGNOSIS — F4321 Adjustment disorder with depressed mood: Secondary | ICD-10-CM | POA: Diagnosis not present

## 2022-07-26 DIAGNOSIS — M545 Low back pain, unspecified: Secondary | ICD-10-CM | POA: Diagnosis not present

## 2022-07-26 DIAGNOSIS — I10 Essential (primary) hypertension: Secondary | ICD-10-CM | POA: Diagnosis not present

## 2022-07-26 DIAGNOSIS — E785 Hyperlipidemia, unspecified: Secondary | ICD-10-CM | POA: Diagnosis not present

## 2022-07-26 DIAGNOSIS — N529 Male erectile dysfunction, unspecified: Secondary | ICD-10-CM | POA: Diagnosis not present

## 2022-08-06 DIAGNOSIS — F4321 Adjustment disorder with depressed mood: Secondary | ICD-10-CM | POA: Diagnosis not present

## 2022-10-05 ENCOUNTER — Other Ambulatory Visit: Payer: Self-pay | Admitting: Neurology

## 2022-10-05 DIAGNOSIS — G4752 REM sleep behavior disorder: Secondary | ICD-10-CM

## 2022-10-05 DIAGNOSIS — K5900 Constipation, unspecified: Secondary | ICD-10-CM

## 2022-10-05 DIAGNOSIS — G20C Parkinsonism, unspecified: Secondary | ICD-10-CM

## 2022-10-20 DIAGNOSIS — R7303 Prediabetes: Secondary | ICD-10-CM | POA: Diagnosis not present

## 2022-10-20 DIAGNOSIS — E0590 Thyrotoxicosis, unspecified without thyrotoxic crisis or storm: Secondary | ICD-10-CM | POA: Diagnosis not present

## 2022-10-20 DIAGNOSIS — Z125 Encounter for screening for malignant neoplasm of prostate: Secondary | ICD-10-CM | POA: Diagnosis not present

## 2022-10-20 DIAGNOSIS — E041 Nontoxic single thyroid nodule: Secondary | ICD-10-CM | POA: Diagnosis not present

## 2022-10-20 DIAGNOSIS — I10 Essential (primary) hypertension: Secondary | ICD-10-CM | POA: Diagnosis not present

## 2022-10-20 DIAGNOSIS — D649 Anemia, unspecified: Secondary | ICD-10-CM | POA: Diagnosis not present

## 2022-10-20 DIAGNOSIS — E782 Mixed hyperlipidemia: Secondary | ICD-10-CM | POA: Diagnosis not present

## 2022-10-24 DIAGNOSIS — Z23 Encounter for immunization: Secondary | ICD-10-CM | POA: Diagnosis not present

## 2022-10-24 DIAGNOSIS — Z125 Encounter for screening for malignant neoplasm of prostate: Secondary | ICD-10-CM | POA: Diagnosis not present

## 2022-10-24 DIAGNOSIS — Z1211 Encounter for screening for malignant neoplasm of colon: Secondary | ICD-10-CM | POA: Diagnosis not present

## 2022-10-24 DIAGNOSIS — G20A2 Parkinson's disease without dyskinesia, with fluctuations: Secondary | ICD-10-CM | POA: Diagnosis not present

## 2022-10-24 DIAGNOSIS — R1319 Other dysphagia: Secondary | ICD-10-CM | POA: Diagnosis not present

## 2022-10-24 DIAGNOSIS — K5909 Other constipation: Secondary | ICD-10-CM | POA: Diagnosis not present

## 2022-10-24 DIAGNOSIS — Z0001 Encounter for general adult medical examination with abnormal findings: Secondary | ICD-10-CM | POA: Diagnosis not present

## 2022-10-24 DIAGNOSIS — I10 Essential (primary) hypertension: Secondary | ICD-10-CM | POA: Diagnosis not present

## 2022-10-24 DIAGNOSIS — I739 Peripheral vascular disease, unspecified: Secondary | ICD-10-CM | POA: Diagnosis not present

## 2022-10-24 DIAGNOSIS — D649 Anemia, unspecified: Secondary | ICD-10-CM | POA: Diagnosis not present

## 2022-11-03 ENCOUNTER — Telehealth (HOSPITAL_COMMUNITY): Payer: Self-pay | Admitting: *Deleted

## 2022-11-03 NOTE — Telephone Encounter (Signed)
2nd attempt to contact patient to schedule OP MBS. Left VM. RKEEL ?

## 2022-11-08 DIAGNOSIS — G20A1 Parkinson's disease without dyskinesia, without mention of fluctuations: Secondary | ICD-10-CM | POA: Diagnosis not present

## 2022-11-10 ENCOUNTER — Telehealth (HOSPITAL_COMMUNITY): Payer: Self-pay | Admitting: *Deleted

## 2022-11-10 NOTE — Telephone Encounter (Signed)
Attempted to contact patient to schedule OP MBS. Left VM. RKEEL 

## 2022-11-18 ENCOUNTER — Telehealth (HOSPITAL_COMMUNITY): Payer: Self-pay | Admitting: *Deleted

## 2022-11-18 NOTE — Telephone Encounter (Signed)
Attempted to contact patient to schedule OP MBS. Left VM. RKEEL 

## 2022-11-22 DIAGNOSIS — I10 Essential (primary) hypertension: Secondary | ICD-10-CM | POA: Diagnosis not present

## 2022-11-22 DIAGNOSIS — I25119 Atherosclerotic heart disease of native coronary artery with unspecified angina pectoris: Secondary | ICD-10-CM | POA: Diagnosis not present

## 2022-11-22 DIAGNOSIS — Z6828 Body mass index (BMI) 28.0-28.9, adult: Secondary | ICD-10-CM | POA: Diagnosis not present

## 2022-11-22 DIAGNOSIS — E785 Hyperlipidemia, unspecified: Secondary | ICD-10-CM | POA: Diagnosis not present

## 2022-11-29 ENCOUNTER — Telehealth (HOSPITAL_COMMUNITY): Payer: Self-pay | Admitting: *Deleted

## 2022-11-29 NOTE — Telephone Encounter (Signed)
Unable to contact patient x 3 to schedule OP MBS. Will close order at this time.

## 2022-12-19 ENCOUNTER — Telehealth: Payer: Self-pay

## 2022-12-19 NOTE — Telephone Encounter (Signed)
Pt has left a VM stating he needs to reschedule his appt with [facility] NP tomorrow 9/24. Please to call patient back to reschedule to her next available OV appt. Thanks

## 2022-12-20 ENCOUNTER — Ambulatory Visit: Payer: BC Managed Care – PPO | Admitting: Adult Health

## 2022-12-21 ENCOUNTER — Ambulatory Visit: Payer: BC Managed Care – PPO | Admitting: Adult Health

## 2023-05-25 ENCOUNTER — Other Ambulatory Visit: Payer: Self-pay | Admitting: Adult Health

## 2023-08-29 ENCOUNTER — Telehealth: Payer: Self-pay | Admitting: Adult Health

## 2023-08-29 NOTE — Telephone Encounter (Signed)
 Pt cancelled appointment due to scheduling conflict.  Will call back to reschedule.

## 2023-08-31 ENCOUNTER — Ambulatory Visit: Payer: BC Managed Care – PPO | Admitting: Adult Health
